# Patient Record
Sex: Female | Born: 1970
Health system: Southern US, Community
[De-identification: ages and names within clinical notes are randomized; demographics above are authoritative.]

## PROBLEM LIST (undated history)

## (undated) DIAGNOSIS — K439 Ventral hernia without obstruction or gangrene: Secondary | ICD-10-CM

## (undated) DIAGNOSIS — F419 Anxiety disorder, unspecified: Secondary | ICD-10-CM

## (undated) DIAGNOSIS — G43909 Migraine, unspecified, not intractable, without status migrainosus: Secondary | ICD-10-CM

## (undated) DIAGNOSIS — E785 Hyperlipidemia, unspecified: Secondary | ICD-10-CM

## (undated) DIAGNOSIS — I10 Essential (primary) hypertension: Secondary | ICD-10-CM

## (undated) HISTORY — PX: ABDOMINAL HYSTERECTOMY: SHX81

## (undated) HISTORY — DX: Ventral hernia without obstruction or gangrene: K43.9

## (undated) HISTORY — DX: Essential (primary) hypertension: I10

## (undated) HISTORY — DX: Hyperlipidemia, unspecified: E78.5

## (undated) HISTORY — DX: Migraine, unspecified, not intractable, without status migrainosus: G43.909

## (undated) HISTORY — PX: HERNIA REPAIR: SHX51

---

## 1998-07-08 HISTORY — PX: NASAL TURBINATE REDUCTION: SHX2072

## 2003-11-15 ENCOUNTER — Inpatient Hospital Stay (HOSPITAL_COMMUNITY): Admission: RE | Admit: 2003-11-15 | Discharge: 2003-11-18 | Payer: Self-pay | Admitting: Obstetrics and Gynecology

## 2006-07-08 HISTORY — PX: TONSILLECTOMY: SUR1361

## 2011-06-18 ENCOUNTER — Ambulatory Visit (INDEPENDENT_AMBULATORY_CARE_PROVIDER_SITE_OTHER): Payer: 59

## 2011-06-18 DIAGNOSIS — L258 Unspecified contact dermatitis due to other agents: Secondary | ICD-10-CM

## 2011-07-18 ENCOUNTER — Ambulatory Visit (INDEPENDENT_AMBULATORY_CARE_PROVIDER_SITE_OTHER): Payer: 59

## 2011-07-18 DIAGNOSIS — I10 Essential (primary) hypertension: Secondary | ICD-10-CM

## 2011-07-18 DIAGNOSIS — E785 Hyperlipidemia, unspecified: Secondary | ICD-10-CM

## 2011-08-13 ENCOUNTER — Ambulatory Visit (INDEPENDENT_AMBULATORY_CARE_PROVIDER_SITE_OTHER): Payer: 59 | Admitting: Family Medicine

## 2011-08-13 VITALS — BP 132/91 | HR 65 | Temp 97.8°F | Resp 16 | Ht 65.25 in | Wt 205.0 lb

## 2011-08-13 DIAGNOSIS — R1084 Generalized abdominal pain: Secondary | ICD-10-CM

## 2011-08-13 DIAGNOSIS — K439 Ventral hernia without obstruction or gangrene: Secondary | ICD-10-CM

## 2011-08-13 NOTE — Progress Notes (Signed)
  Subjective:    Patient ID: Gabriela Torres, female    DOB: 22-Nov-1970, 41 y.o.   MRN: [id number]  HPI Presents c/o pain and bulging above umbilicus worse after heavy lifting.  Employment involves daily lifting of heavy coolers.  No nausea or vomiting No fever  TAH secondary to fibroids 2005 Review of Systems     Objective:   Physical Exam  Constitutional: She appears well-developed and well-nourished.  Neck: Normal range of motion. Neck supple.  Cardiovascular: Normal rate, regular rhythm and normal heart sounds.   Pulmonary/Chest: Effort normal and breath sounds normal.  Abdominal: Soft. Bowel sounds are normal. A hernia is present.            Assessment & Plan:   1. Abdominal wall hernia  Ambulatory referral to General Surgery   Anticipatory guidance

## 2011-08-14 ENCOUNTER — Telehealth: Payer: Self-pay

## 2011-08-14 NOTE — Telephone Encounter (Signed)
Pt would like to have something called in for pain that she is having from her hernia. [phone]

## 2011-08-14 NOTE — Telephone Encounter (Signed)
Dr. Hal Hope,  Please review this request. I do not see that this was discussed at her last OV.  Ryan

## 2011-08-15 NOTE — Telephone Encounter (Signed)
Pt calling again to check status of pain rx for hernia. Pt states she has surgery scheduled soon.  Best: [phone]  bf

## 2011-08-16 ENCOUNTER — Telehealth: Payer: Self-pay | Admitting: Family Medicine

## 2011-08-16 MED ORDER — TRAMADOL HCL 50 MG PO TABS: 50.0000 mg | ORAL_TABLET | Freq: Three times a day (TID) | ORAL | Status: AC | PRN

## 2011-08-16 NOTE — Telephone Encounter (Signed)
PT CALLING AGAIN TO CHECK ON PAIN MEDS

## 2011-08-16 NOTE — Telephone Encounter (Signed)
Called pt to let her know rx called into the [facility] on [address] in [place]. Dr Hal Hope sent.

## 2011-08-19 ENCOUNTER — Telehealth: Payer: Self-pay

## 2011-08-19 NOTE — Telephone Encounter (Signed)
Pt states was given a rx for tramodal but it is not helping and would like to have something else called in

## 2011-08-21 ENCOUNTER — Telehealth: Payer: Self-pay

## 2011-08-21 NOTE — Telephone Encounter (Signed)
.  [facility] PT HAVE BEEN CALLING QUITE A BIT REGARDING HER MEDICINE, THE TRAMADOL ISN'T WORKING AND SHE NEEDED SOMETHING ELSE FOR PAIN CALLED IN PLEASE CALL PT AT [phone]   [facility] ON [address] IN [place]

## 2011-08-21 NOTE — Telephone Encounter (Signed)
Pt called wanting to make sure you understood that she is not allergic to codeine derivatives.  She is also not allergic to hydrocodone.  Pt states she is only allergic to the codeine/hydrocodone cough syrup.  Tramadol is just not cutting it for her pain especially while she is trying to work.  Please address.

## 2011-08-21 NOTE — Telephone Encounter (Signed)
Patient needing stronger pain medication. Patient is allergic to codeine derivatives. She can try taking 2 ultram every 12 hours as needed.  If pain persistant or worsening encourage her to RTC as she seemed fairly comfortable at her OV.

## 2011-08-21 NOTE — Telephone Encounter (Signed)
Dr. Hal Hope pt states the tramadol is not easing the pain for her Hernia.  She sees the specialist on Feb 26. Would like a call back [phone]

## 2011-08-21 NOTE — Telephone Encounter (Signed)
LMOM with specific instructions per Dr Hal Hope and to RTC if increased dose does not help. Asked for CB with any other questions.

## 2011-08-22 MED ORDER — HYDROCODONE-ACETAMINOPHEN 5-500 MG PO TABS: 1.0000 | ORAL_TABLET | Freq: Three times a day (TID) | ORAL | Status: AC | PRN

## 2011-08-22 NOTE — Telephone Encounter (Signed)
Please call in [place] 5/500 1 po q 8 hours prn #[phone]

## 2011-08-22 NOTE — Telephone Encounter (Signed)
LMOM THAT PATIENT CAN PICK UP RX THAT WAS FAXED.

## 2011-08-22 NOTE — Telephone Encounter (Signed)
Written and signed - just needs to be faxed.

## 2011-09-03 ENCOUNTER — Encounter (INDEPENDENT_AMBULATORY_CARE_PROVIDER_SITE_OTHER): Payer: Self-pay | Admitting: General Surgery

## 2011-09-03 ENCOUNTER — Ambulatory Visit (INDEPENDENT_AMBULATORY_CARE_PROVIDER_SITE_OTHER): Payer: 59 | Admitting: General Surgery

## 2011-09-03 VITALS — BP 146/88 | HR 68 | Temp 97.9°F | Resp 18 | Ht 65.0 in | Wt 205.2 lb

## 2011-09-03 DIAGNOSIS — K439 Ventral hernia without obstruction or gangrene: Secondary | ICD-10-CM | POA: Insufficient documentation

## 2011-09-03 NOTE — Patient Instructions (Signed)
We will call with results of CT scan.

## 2011-09-03 NOTE — Progress Notes (Signed)
Subjective:     Patient ID: Gabriela Torres, female   DOB: 12/01/1970, 41 y.o.   MRN: [id number]  HPI We are asked to see the patient in consultation by Dr. Nadyne Coombes to evaluate her for a ventral hernia. The patient is a 41 year old black female who has been experiencing abdominal pain for about the last 3 weeks. She does not remember any particular event that started her pain but she has been lifting heavy jug of water at work. The pain originally started down low on her abdomen near her midline. The pain has migrated up and now she states that she has a burning pain across her upper abdomen. She denies any nausea or vomiting. She denies any fevers or chills. Her bowels have been moving pretty regularly.  Review of Systems  Constitutional: Negative.   HENT: Negative.   Eyes: Negative.   Respiratory: Negative.   Cardiovascular: Negative.   Gastrointestinal: Positive for abdominal pain. Negative for abdominal distention.  Genitourinary: Negative.   Musculoskeletal: Negative.   Skin: Negative.   Neurological: Negative.   Hematological: Negative.   Psychiatric/Behavioral: Negative.        Objective:   Physical Exam  Constitutional: She is oriented to person, place, and time. She appears well-developed and well-nourished.  HENT:  Head: Normocephalic and atraumatic.  Eyes: Conjunctivae and EOM are normal. Pupils are equal, round, and reactive to light.  Neck: Normal range of motion. Neck supple.  Cardiovascular: Normal rate, regular rhythm and normal heart sounds.   Pulmonary/Chest: Effort normal and breath sounds normal.  Abdominal: Soft. Bowel sounds are normal.       She has some palpable fullness just to the right of her midline incision and just above her umbilicus. She has no palpable fascial defect. No abdominal distention. She is tender over the palpable fullness.  Musculoskeletal: Normal range of motion.  Neurological: She is alert and oriented to person, place, and time.    Skin: Skin is warm and dry.  Psychiatric: She has a normal mood and affect. Her behavior is normal.       Assessment:     Abdominal pain with a possible ventral hernia.    Plan:     At this point I would like to obtain a CT scan to confirm that she has a ventral hernia and to evaluate the size and extent of the hernia. Since her pain has seemed to be remote from the palpable fullness I would also like to make sure that we are not missing anything else in her abdomen before we operate on her. We will call her with the results of the study and plan for a laparoscopic assisted ventral hernia if this is all that's found. I've discussed with her in detail the risks and benefits of the operation to fix the hernia as well as some of the technical aspects including the risks of infection and injury to the intestine and she understands and wishes to proceed

## 2011-09-06 ENCOUNTER — Ambulatory Visit
Admission: RE | Admit: 2011-09-06 | Discharge: 2011-09-06 | Disposition: A | Discharge status: Home / Self Care | Payer: 59 | Source: Ambulatory Visit | Attending: General Surgery | Admitting: General Surgery

## 2011-09-06 DIAGNOSIS — K439 Ventral hernia without obstruction or gangrene: Secondary | ICD-10-CM

## 2011-09-06 MED ORDER — IOHEXOL 300 MG/ML  SOLN
125.0000 mL | Freq: Once | INTRAMUSCULAR | Status: AC | PRN
  Administered 2011-09-06: 125 mL via INTRAVENOUS

## 2011-09-09 ENCOUNTER — Other Ambulatory Visit (INDEPENDENT_AMBULATORY_CARE_PROVIDER_SITE_OTHER): Payer: Self-pay | Admitting: General Surgery

## 2011-09-09 NOTE — Progress Notes (Signed)
Done- Orders to schedulers box.

## 2011-10-10 NOTE — Telephone Encounter (Signed)
Hernia discussed along with pain management 08/16/11

## 2011-10-18 ENCOUNTER — Encounter (HOSPITAL_COMMUNITY): Payer: Self-pay

## 2011-10-24 NOTE — Pre-Procedure Instructions (Addendum)
[address] Rideout Segreto  10/24/2011   Your procedure is scheduled on:  Friday, 11/01/2011   Report to [facility] at 5:30 AM.  Call this number if you have problems the morning of surgery: [phone]   Remember:   Do not eat food:After Midnight.  May have clear liquids: up to 4 Hours before arrival(nothing after 1:30 AM).  Clear liquids include soda, tea, black coffee, apple or grape juice, broth.  Take these medicines the morning of surgery with A SIP OF WATER: Xanax, Lexapro  * Discontinue Aspirin,advil any nsaids, Coumadion, Plavix, Effient, and herbal meds 7 days prior to surgery.   Do not wear jewelry, make-up or nail polish.  Do not wear lotions, powders, or perfumes. You may wear deodorant.  Do not shave 48 hours prior to surgery.  Do not bring valuables to the hospital.  Contacts, dentures or bridgework may not be worn into surgery.  Leave suitcase in the car. After surgery it may be brought to your room.  For patients admitted to the hospital, checkout time is 11:00 AM the day of discharge.   Patients discharged the day of surgery will not be allowed to drive home.  Name and phone number of your driver: Clydie Braun [phone] sister  Special Instructions: CHG Shower Use Special Wash: 1/2 bottle night before surgery and 1/2 bottle morning of surgery.   Please read over the following fact sheets that you were given: Pain Booklet, Coughing and Deep Breathing and Surgical Site Infection Prevention

## 2011-10-25 ENCOUNTER — Encounter (HOSPITAL_COMMUNITY)
Admission: RE | Admit: 2011-10-25 | Discharge: 2011-10-25 | Disposition: A | Discharge status: Home / Self Care | Payer: 59 | Source: Ambulatory Visit | Attending: Anesthesiology | Admitting: Anesthesiology

## 2011-10-25 ENCOUNTER — Encounter (HOSPITAL_COMMUNITY): Payer: Self-pay

## 2011-10-25 ENCOUNTER — Encounter (HOSPITAL_COMMUNITY)
Admission: RE | Admit: 2011-10-25 | Discharge: 2011-10-25 | Disposition: A | Discharge status: Home / Self Care | Payer: 59 | Source: Ambulatory Visit | Attending: General Surgery | Admitting: General Surgery

## 2011-10-25 HISTORY — DX: Anxiety disorder, unspecified: F41.9

## 2011-10-25 LAB — BASIC METABOLIC PANEL
BUN: 13 mg/dL (ref 6–23)
Calcium: 9.5 mg/dL (ref 8.4–10.5)
GFR calc Af Amer: >90 mL/min (ref >90)
GFR calc non Af Amer: >90 mL/min (ref >90)
Glucose, Bld: 85 mg/dL (ref 70–99)
Potassium: 3.6 mEq/L (ref 3.5–5.1)
Sodium: 137 mEq/L (ref 135–145)

## 2011-10-25 LAB — CBC
HCT: 37.1 % (ref 36.0–46.0)
Platelets: 221 10*3/uL (ref 150–400)
RBC: 4.95 MIL/uL (ref 3.87–5.11)
RDW: 14.9 % (ref 11.5–15.5)
WBC: 5.1 10*3/uL (ref 4.0–10.5)

## 2011-10-25 LAB — CARDIAC PANEL(CRET KIN+CKTOT+MB+TROPI)
CK, MB: 1.5 ng/mL (ref 0.3–4.0)
Relative Index: 1.1 (ref 0.0–2.5)
Troponin I: <0.3 ng/mL (ref <0.30)

## 2011-10-25 LAB — SURGICAL PCR SCREEN: MRSA, PCR: NEGATIVE

## 2011-10-25 MED ORDER — CHLORHEXIDINE GLUCONATE 4 % EX LIQD: 1.0000 "application " | Freq: Once | CUTANEOUS | Status: DC

## 2011-10-25 NOTE — Progress Notes (Addendum)
  ekg abn in preop.  Dr Phoebe Perch called. To show to [facility]  [facility]. Orders for further labs. parient waiting for results Dr cooper  Cardiac called    had reviewed ekg. No orders.

## 2011-10-25 NOTE — Consult Note (Addendum)
Anesthesia:  Patient is a 41 year old female scheduled for a laparoscopic VHR with mesh on 11/01/11.  History includes HTN, HLD, former smoker, anxiety, migraines, prior TAH, tonsillectomy, and turbinate reduction.  PCP is Dr. Nadyne Coombes at [facility].  I saw her earlier today during her PAT appointment due to her EKG findings showing SR with first degree AVB, mild (1-2 small boxes) ST elevation in lateral leads, cannot rule out anterior infarct (age undetermined).  She has never had a prior EKG or any cardiac studies.  She denies CP currently.  She denies SOB, nausea, diaphoresis, jaw or arm pain.  She has had periodic twinges of chest pain that typically only last a few seconds and have no associated symptoms.  These have occurred at rest and go away on their own.  Up to when her hernia began causing her discomfort a few months ago she was running weekly (5K) and did not have any chest pain with this type of activity.  These sound atypical for cardiac etiology.  She has no family history of CAD, but a sister does have CHF secondary to viral cardiomyopathy.  Exam findings show her heart with a RRR, slightly bradycardic at 57-60 bpm.  No murmurs appreciated.  Lungs clear.  No carotid bruits auscultated.  No significant edema noted.  CXR shows no acute abnormalities.  Labs acceptable.  Due to her abnormal EKG one set of cardiac enzymes were added to her lab work.  Patient remained in Short Stay until her lab work was back.  These were WNL showing a CK of 141, MB 1.5, and Trop I < 0.30.    Dr. Excell Seltzer who was the Cardiologist interpreting her EKG also called and spoke with the PAT nurse to ensure patient was asymptomatic.  Since she was, he felt this was most likely a normal variant.    I reviewed above and patient's EKG with Anesthesiologist Dr. Chaney Malling who agrees that since patient is currently asymptomatic, cardiac enzymes are WNL, and she had excellent exercise tolerance up to a few  months ago due to pain from her hernia, that she should be okay to proceed if no new worrisome symptoms.  Patient was updated, and told that it did not appear that she was actually having a MI, but to seek immediate medical attention if CV symptoms developed.

## 2011-10-31 MED ORDER — CEFAZOLIN SODIUM-DEXTROSE 2-3 GM-% IV SOLR
2.0000 g | INTRAVENOUS | Status: AC
  Administered 2011-11-01: 2 g via INTRAVENOUS
  Filled 2011-10-31: qty 50

## 2011-11-01 ENCOUNTER — Inpatient Hospital Stay (HOSPITAL_COMMUNITY)
Admission: RE | Admit: 2011-11-01 | Discharge: 2011-11-03 | DRG: 355 | Disposition: A | Discharge status: Home / Self Care | Payer: 59 | Source: Ambulatory Visit | Attending: General Surgery | Admitting: General Surgery

## 2011-11-01 ENCOUNTER — Encounter (HOSPITAL_COMMUNITY)
Admission: RE | Disposition: A | Discharge status: Home / Self Care | Payer: Self-pay | Source: Ambulatory Visit | Attending: General Surgery

## 2011-11-01 ENCOUNTER — Encounter (HOSPITAL_COMMUNITY): Payer: Self-pay | Admitting: Vascular Surgery

## 2011-11-01 ENCOUNTER — Encounter (HOSPITAL_COMMUNITY): Payer: Self-pay | Admitting: *Deleted

## 2011-11-01 ENCOUNTER — Ambulatory Visit (HOSPITAL_COMMUNITY): Payer: 59 | Admitting: Vascular Surgery

## 2011-11-01 DIAGNOSIS — I10 Essential (primary) hypertension: Secondary | ICD-10-CM | POA: Diagnosis present

## 2011-11-01 DIAGNOSIS — E669 Obesity, unspecified: Secondary | ICD-10-CM | POA: Diagnosis present

## 2011-11-01 DIAGNOSIS — Z6832 Body mass index (BMI) 32.0-32.9, adult: Secondary | ICD-10-CM

## 2011-11-01 DIAGNOSIS — K439 Ventral hernia without obstruction or gangrene: Secondary | ICD-10-CM

## 2011-11-01 DIAGNOSIS — R51 Headache: Secondary | ICD-10-CM | POA: Diagnosis present

## 2011-11-01 DIAGNOSIS — F411 Generalized anxiety disorder: Secondary | ICD-10-CM | POA: Diagnosis present

## 2011-11-01 DIAGNOSIS — Z01810 Encounter for preprocedural cardiovascular examination: Secondary | ICD-10-CM

## 2011-11-01 HISTORY — PX: VENTRAL HERNIA REPAIR: SHX424

## 2011-11-01 SURGERY — REPAIR, HERNIA, VENTRAL, LAPAROSCOPIC
Anesthesia: General | Site: Abdomen | Wound class: Clean

## 2011-11-01 MED ORDER — ALPRAZOLAM 0.25 MG PO TABS: 0.2500 mg | ORAL_TABLET | Freq: Three times a day (TID) | ORAL | Status: DC | PRN

## 2011-11-01 MED ORDER — ESCITALOPRAM OXALATE 20 MG PO TABS
30.0000 mg | ORAL_TABLET | Freq: Every day | ORAL | Status: DC
  Administered 2011-11-02 – 2011-11-03 (×2): 30 mg via ORAL
  Filled 2011-11-01 (×2): qty 1

## 2011-11-01 MED ORDER — HYDROCODONE-ACETAMINOPHEN 5-325 MG PO TABS
1.0000 | ORAL_TABLET | ORAL | Status: DC | PRN
  Administered 2011-11-01 (×3): 2 via ORAL
  Administered 2011-11-01: 1 via ORAL
  Administered 2011-11-02 – 2011-11-03 (×5): 2 via ORAL
  Filled 2011-11-01 (×8): qty 2

## 2011-11-01 MED ORDER — FLUTICASONE PROPIONATE 50 MCG/ACT NA SUSP
2.0000 | Freq: Every day | NASAL | Status: DC
  Administered 2011-11-03: 2 via NASAL
  Filled 2011-11-01: qty 16

## 2011-11-01 MED ORDER — LACTATED RINGERS IV SOLN
INTRAVENOUS | Status: DC | PRN
  Administered 2011-11-01 (×3): via INTRAVENOUS

## 2011-11-01 MED ORDER — HYDROMORPHONE HCL PF 1 MG/ML IJ SOLN
0.2500 mg | INTRAMUSCULAR | Status: DC | PRN
  Administered 2011-11-01 (×4): 0.5 mg via INTRAVENOUS

## 2011-11-01 MED ORDER — ROCURONIUM BROMIDE 100 MG/10ML IV SOLN
INTRAVENOUS | Status: DC | PRN
  Administered 2011-11-01: 40 mg via INTRAVENOUS
  Administered 2011-11-01: 10 mg via INTRAVENOUS

## 2011-11-01 MED ORDER — FLUTICASONE PROPIONATE 50 MCG/ACT NA SUSP: 3.0000 | Freq: Every day | NASAL | Status: DC

## 2011-11-01 MED ORDER — HYDROCODONE-ACETAMINOPHEN 5-325 MG PO TABS
ORAL_TABLET | ORAL | Status: AC
  Filled 2011-11-01: qty 2

## 2011-11-01 MED ORDER — DEXAMETHASONE SODIUM PHOSPHATE 10 MG/ML IJ SOLN
INTRAMUSCULAR | Status: DC | PRN
  Administered 2011-11-01: 4 mg via INTRAVENOUS

## 2011-11-01 MED ORDER — ONDANSETRON HCL 4 MG PO TABS: 4.0000 mg | ORAL_TABLET | Freq: Four times a day (QID) | ORAL | Status: DC | PRN

## 2011-11-01 MED ORDER — LISINOPRIL 20 MG PO TABS
20.0000 mg | ORAL_TABLET | Freq: Every day | ORAL | Status: DC
  Administered 2011-11-01 – 2011-11-03 (×3): 20 mg via ORAL
  Filled 2011-11-01 (×3): qty 1

## 2011-11-01 MED ORDER — SUMATRIPTAN SUCCINATE 25 MG PO TABS
25.0000 mg | ORAL_TABLET | ORAL | Status: DC | PRN
Start: 2011-11-01 — End: 2011-11-03
  Filled 2011-11-01: qty 1

## 2011-11-01 MED ORDER — EZETIMIBE 10 MG PO TABS
10.0000 mg | ORAL_TABLET | Freq: Every day | ORAL | Status: DC
  Administered 2011-11-02 – 2011-11-03 (×2): 10 mg via ORAL
  Filled 2011-11-01 (×2): qty 1

## 2011-11-01 MED ORDER — ESCITALOPRAM OXALATE 10 MG PO TABS: 10.0000 mg | ORAL_TABLET | Freq: Every day | ORAL | Status: DC

## 2011-11-01 MED ORDER — KCL IN DEXTROSE-NACL 20-5-0.9 MEQ/L-%-% IV SOLN
INTRAVENOUS | Status: DC
  Administered 2011-11-01: 14:00:00 via INTRAVENOUS
  Administered 2011-11-02: 1000 mL via INTRAVENOUS
  Administered 2011-11-03: 04:00:00 via INTRAVENOUS
  Filled 2011-11-01 (×5): qty 1000

## 2011-11-01 MED ORDER — GLYCOPYRROLATE 0.2 MG/ML IJ SOLN
INTRAMUSCULAR | Status: DC | PRN
  Administered 2011-11-01: 0.6 mg via INTRAVENOUS

## 2011-11-01 MED ORDER — KCL IN DEXTROSE-NACL 20-5-0.9 MEQ/L-%-% IV SOLN
INTRAVENOUS | Status: DC
  Administered 2011-11-02: 15:00:00 via INTRAVENOUS
  Filled 2011-11-01 (×7): qty 1000

## 2011-11-01 MED ORDER — SUMATRIPTAN SUCCINATE 25 MG PO TABS: 25.0000 mg | ORAL_TABLET | ORAL | Status: DC | PRN

## 2011-11-01 MED ORDER — LISINOPRIL-HYDROCHLOROTHIAZIDE 20-12.5 MG PO TABS: 1.0000 | ORAL_TABLET | Freq: Every day | ORAL | Status: DC

## 2011-11-01 MED ORDER — KCL IN DEXTROSE-NACL 20-5-0.45 MEQ/L-%-% IV SOLN
INTRAVENOUS | Status: AC
  Filled 2011-11-01: qty 1000

## 2011-11-01 MED ORDER — PROMETHAZINE HCL 25 MG/ML IJ SOLN
6.2500 mg | INTRAMUSCULAR | Status: DC | PRN
  Administered 2011-11-01: 6.25 mg via INTRAVENOUS

## 2011-11-01 MED ORDER — LIDOCAINE HCL (CARDIAC) 20 MG/ML IV SOLN
INTRAVENOUS | Status: DC | PRN
  Administered 2011-11-01: 100 mg via INTRAVENOUS

## 2011-11-01 MED ORDER — SODIUM CHLORIDE 0.9 % IR SOLN
Status: DC | PRN
  Administered 2011-11-01: 1000 mL

## 2011-11-01 MED ORDER — PANTOPRAZOLE SODIUM 40 MG IV SOLR
40.0000 mg | INTRAVENOUS | Status: DC
  Administered 2011-11-01: 40 mg via INTRAVENOUS
  Filled 2011-11-01 (×2): qty 40

## 2011-11-01 MED ORDER — MORPHINE SULFATE 4 MG/ML IJ SOLN
4.0000 mg | INTRAMUSCULAR | Status: DC | PRN
  Administered 2011-11-01 – 2011-11-03 (×7): 4 mg via INTRAVENOUS
  Filled 2011-11-01 (×7): qty 1

## 2011-11-01 MED ORDER — BUPIVACAINE-EPINEPHRINE 0.25% -1:200000 IJ SOLN
INTRAMUSCULAR | Status: DC | PRN
  Administered 2011-11-01: 30 mL

## 2011-11-01 MED ORDER — ONDANSETRON HCL 4 MG/2ML IJ SOLN
INTRAMUSCULAR | Status: DC | PRN
  Administered 2011-11-01: 4 mg via INTRAVENOUS

## 2011-11-01 MED ORDER — ESCITALOPRAM OXALATE 20 MG PO TABS: 20.0000 mg | ORAL_TABLET | Freq: Every day | ORAL | Status: DC

## 2011-11-01 MED ORDER — PROPOFOL 10 MG/ML IV EMUL
INTRAVENOUS | Status: DC | PRN
  Administered 2011-11-01: 200 mg via INTRAVENOUS

## 2011-11-01 MED ORDER — HYDROMORPHONE HCL PF 1 MG/ML IJ SOLN
INTRAMUSCULAR | Status: AC
  Filled 2011-11-01: qty 1

## 2011-11-01 MED ORDER — FENTANYL CITRATE 0.05 MG/ML IJ SOLN
INTRAMUSCULAR | Status: DC | PRN
  Administered 2011-11-01 (×2): 50 ug via INTRAVENOUS
  Administered 2011-11-01: 150 ug via INTRAVENOUS

## 2011-11-01 MED ORDER — NEOSTIGMINE METHYLSULFATE 1 MG/ML IJ SOLN
INTRAMUSCULAR | Status: DC | PRN
  Administered 2011-11-01: 3 mg via INTRAVENOUS

## 2011-11-01 MED ORDER — ONDANSETRON HCL 4 MG/2ML IJ SOLN: 4.0000 mg | Freq: Four times a day (QID) | INTRAMUSCULAR | Status: DC | PRN

## 2011-11-01 MED ORDER — PROMETHAZINE HCL 25 MG/ML IJ SOLN
INTRAMUSCULAR | Status: AC
  Filled 2011-11-01: qty 1

## 2011-11-01 MED ORDER — MIDAZOLAM HCL 5 MG/5ML IJ SOLN
INTRAMUSCULAR | Status: DC | PRN
  Administered 2011-11-01: 2 mg via INTRAVENOUS

## 2011-11-01 MED ORDER — HYDROCHLOROTHIAZIDE 12.5 MG PO CAPS
12.5000 mg | ORAL_CAPSULE | Freq: Every day | ORAL | Status: DC
  Administered 2011-11-01 – 2011-11-03 (×3): 12.5 mg via ORAL
  Filled 2011-11-01 (×3): qty 1

## 2011-11-01 SURGICAL SUPPLY — 49 items
APPLIER CLIP LOGIC TI 5 (MISCELLANEOUS) IMPLANT
APPLIER CLIP ROT 10 11.4 M/L (STAPLE)
BANDAGE GAUZE ELAST BULKY 4 IN (GAUZE/BANDAGES/DRESSINGS) ×2 IMPLANT
CANISTER SUCTION 2500CC (MISCELLANEOUS) IMPLANT
CATH FOLEY 2WAY SLVR  5CC 14FR (CATHETERS) ×1
CATH FOLEY 2WAY SLVR 5CC 14FR (CATHETERS) ×1 IMPLANT
CHLORAPREP W/TINT 26ML (MISCELLANEOUS) ×2 IMPLANT
CLIP APPLIE ROT 10 11.4 M/L (STAPLE) IMPLANT
CLOTH BEACON ORANGE TIMEOUT ST (SAFETY) ×2 IMPLANT
COVER SURGICAL LIGHT HANDLE (MISCELLANEOUS) ×2 IMPLANT
DECANTER SPIKE VIAL GLASS SM (MISCELLANEOUS) ×2 IMPLANT
DERMABOND ADHESIVE PROPEN (GAUZE/BANDAGES/DRESSINGS) ×1
DERMABOND ADVANCED (GAUZE/BANDAGES/DRESSINGS) ×1
DERMABOND ADVANCED .7 DNX12 (GAUZE/BANDAGES/DRESSINGS) ×1 IMPLANT
DERMABOND ADVANCED .7 DNX6 (GAUZE/BANDAGES/DRESSINGS) ×1 IMPLANT
DEVICE SECURE STRAP 25 ABSORB (INSTRUMENTS) ×2 IMPLANT
DEVICE TROCAR PUNCTURE CLOSURE (ENDOMECHANICALS) ×2 IMPLANT
DRAPE INCISE IOBAN 66X45 STRL (DRAPES) ×2 IMPLANT
DRAPE UTILITY 15X26 W/TAPE STR (DRAPE) ×4 IMPLANT
ELECT CAUTERY BLADE 6.4 (BLADE) ×2 IMPLANT
ELECT REM PT RETURN 9FT ADLT (ELECTROSURGICAL) ×2
ELECTRODE REM PT RTRN 9FT ADLT (ELECTROSURGICAL) ×1 IMPLANT
GLOVE BIO SURGEON STRL SZ7.5 (GLOVE) ×2 IMPLANT
GLOVE BIO SURGEON STRL SZ8 (GLOVE) ×2 IMPLANT
GOWN PREVENTION PLUS XLARGE (GOWN DISPOSABLE) ×2 IMPLANT
GOWN STRL NON-REIN LRG LVL3 (GOWN DISPOSABLE) ×8 IMPLANT
KIT BASIN OR (CUSTOM PROCEDURE TRAY) ×2 IMPLANT
KIT ROOM TURNOVER OR (KITS) ×2 IMPLANT
MESH PHYSIO OVAL 10X15CM (Mesh General) ×2 IMPLANT
NEEDLE SPNL 22GX3.5 QUINCKE BK (NEEDLE) ×2 IMPLANT
NS IRRIG 1000ML POUR BTL (IV SOLUTION) ×2 IMPLANT
PAD ARMBOARD 7.5X6 YLW CONV (MISCELLANEOUS) ×2 IMPLANT
PEN SKIN MARKING BROAD (MISCELLANEOUS) ×2 IMPLANT
PENCIL BUTTON HOLSTER BLD 10FT (ELECTRODE) ×2 IMPLANT
SCALPEL HARMONIC ACE (MISCELLANEOUS) ×2 IMPLANT
SCISSORS LAP 5X35 DISP (ENDOMECHANICALS) IMPLANT
SET IRRIG TUBING LAPAROSCOPIC (IRRIGATION / IRRIGATOR) IMPLANT
SLEEVE ENDOPATH XCEL 5M (ENDOMECHANICALS) ×4 IMPLANT
SUT MNCRL AB 4-0 PS2 18 (SUTURE) ×4 IMPLANT
SUT NOVA NAB DX-16 0-1 5-0 T12 (SUTURE) ×6 IMPLANT
TOWEL OR 17X24 6PK STRL BLUE (TOWEL DISPOSABLE) ×2 IMPLANT
TOWEL OR 17X26 10 PK STRL BLUE (TOWEL DISPOSABLE) ×2 IMPLANT
TRAY FOLEY CATH 14FR (SET/KITS/TRAYS/PACK) ×2 IMPLANT
TRAY LAPAROSCOPIC (CUSTOM PROCEDURE TRAY) ×2 IMPLANT
TROCAR XCEL BLUNT TIP 100MML (ENDOMECHANICALS) IMPLANT
TROCAR XCEL NON-BLD 11X100MML (ENDOMECHANICALS) IMPLANT
TROCAR XCEL NON-BLD 5MMX100MML (ENDOMECHANICALS) ×2 IMPLANT
TUBE CONNECTING 12X1/4 (SUCTIONS) ×2 IMPLANT
YANKAUER SUCT BULB TIP NO VENT (SUCTIONS) ×2 IMPLANT

## 2011-11-01 NOTE — Preoperative (Signed)
Beta Blockers   Reason not to administer Beta Blockers:Not Applicable 

## 2011-11-01 NOTE — Progress Notes (Signed)
UR complete 

## 2011-11-01 NOTE — H&P (Signed)
Gabriela Torres   09/03/2011 9:10 AM Office Visit  MRN: [id number]   Description: 40 year old female  Provider: Robyne Askew, MD  Department: [facility]        Diagnoses     Ventral hernia   - Primary    553.20      Reason for Visit     Hernia    Abdominal wall        Vitals - Last Recorded       BP Pulse Temp(Src) Resp Ht Wt    146/88  68  97.9 F (36.6 C) (Temporal)  18  5\' 5"  (1.651 m)  205 lb 3.2 oz (93.078 kg)          BMI              34.15 kg/m2                 Progress Notes     Robyne Askew, MD  09/03/2011 10:13 AM  SignedSubjective:      Patient ID: Gabriela Torres, female   DOB: March 19, 1971, 41 y.o.   MRN: [id number]   HPI We are asked to see the patient in consultation by Dr. Nadyne Coombes to evaluate her for a ventral hernia. The patient is a 41 year old black female who has been experiencing abdominal pain for about the last 3 weeks. She does not remember any particular event that started her pain but she has been lifting heavy jug of water at work. The pain originally started down low on her abdomen near her midline. The pain has migrated up and now she states that she has a burning pain across her upper abdomen. She denies any nausea or vomiting. She denies any fevers or chills. Her bowels have been moving pretty regularly.   Review of Systems  Constitutional: Negative.   HENT: Negative.   Eyes: Negative.   Respiratory: Negative.   Cardiovascular: Negative.   Gastrointestinal: Positive for abdominal pain. Negative for abdominal distention.  Genitourinary: Negative.   Musculoskeletal: Negative.   Skin: Negative.   Neurological: Negative.   Hematological: Negative.   Psychiatric/Behavioral: Negative.       Objective:    Physical Exam  Constitutional: She is oriented to person, place, and time. She appears well-developed and well-nourished.  HENT:   Head: Normocephalic and atraumatic.  Eyes: Conjunctivae and EOM are normal.  Pupils are equal, round, and reactive to light.  Neck: Normal range of motion. Neck supple.  Cardiovascular: Normal rate, regular rhythm and normal heart sounds.   Pulmonary/Chest: Effort normal and breath sounds normal.  Abdominal: Soft. Bowel sounds are normal.       She has some palpable fullness just to the right of her midline incision and just above her umbilicus. She has no palpable fascial defect. No abdominal distention. She is tender over the palpable fullness.  Musculoskeletal: Normal range of motion.  Neurological: She is alert and oriented to person, place, and time.  Skin: Skin is warm and dry.  Psychiatric: She has a normal mood and affect. Her behavior is normal.      Assessment:    Abdominal pain with a possible ventral hernia.   Plan:      At this point I would like to obtain a CT scan to confirm that she has a ventral hernia and to evaluate the size and extent of the hernia. Since her pain has seemed to be remote from the palpable fullness I  would also like to make sure that we are not missing anything else in her abdomen before we operate on her. We will call her with the results of the study and plan for a laparoscopic assisted ventral hernia if this is all that's found. I've discussed with her in detail the risks and benefits of the operation to fix the hernia as well as some of the technical aspects including the risks of infection and injury to the intestine and she understands and wishes to proceed                Not recorded       Discontinued Medications         Reason for Discontinue    ARIPiprazole (ABILIFY) 10 MG tablet Error       Orders Placed This Encounter       Future Orders    CT Abdomen Pelvis W Contrast [[id number] Custom]    Expected By: 10/11/11    Expires: 12/03/12         Patient Instructions     We will call with results of CT scan       Level of Service     PR OFFICE CONSULTATION NEW/ESTAB PATIENT 40 MIN [91478]          All Flowsheet Templates (all recorded)     Encounter Vitals Flowsheet    Custom Formula Data Flowsheet    Anthropometrics Flowsheet               Referring Provider          Dois Davenport, MD       All Charges for This Encounter       Code Description Service Date Service Provider Modifiers Quantity    [phone] PR OFFICE CONSULTATION NEW/ESTAB PATIENT 40 MIN 09/03/2011 Robyne Askew, MD   1        Other Encounter Related Information     Allergies & Medications         Problem List         History         Patient-Entered Questionnaires     No data filed

## 2011-11-01 NOTE — Anesthesia Postprocedure Evaluation (Signed)
  Anesthesia Post-op Note  Patient: Gabriela Torres  Procedure(s) Performed: Procedure(s) (LRB): LAPAROSCOPIC VENTRAL HERNIA (N/A) INSERTION OF MESH (N/A)  Patient Location: PACU  Anesthesia Type: General  Level of Consciousness: awake and alert   Airway and Oxygen Therapy: Patient Spontanous Breathing  Post-op Pain: mild  Post-op Assessment: Post-op Vital signs reviewed, Patient's Cardiovascular Status Stable, Respiratory Function Stable, Patent Airway and No signs of Nausea or vomiting  Post-op Vital Signs: stable  Complications: No apparent anesthesia complications

## 2011-11-01 NOTE — Interval H&P Note (Signed)
History and Physical Interval Note:  11/01/2011 7:20 AM  Gabriela Torres A Dib  has presented today for surgery, with the diagnosis of ventral hernia   The various methods of treatment have been discussed with the patient and family. After consideration of risks, benefits and other options for treatment, the patient has consented to  Procedure(s) (LRB): LAPAROSCOPIC VENTRAL HERNIA (N/A) INSERTION OF MESH (N/A) as a surgical intervention .  The patients' history has been reviewed, patient examined, no change in status, stable for surgery.  I have reviewed the patients' chart and labs.  Questions were answered to the patient's satisfaction.     TOTH III,Persephanie Laatsch S

## 2011-11-01 NOTE — Op Note (Signed)
11/01/2011  9:22 AM  PATIENT:  Gabriela Torres  41 y.o. female  PRE-OPERATIVE DIAGNOSIS:  ventral hernia   POST-OPERATIVE DIAGNOSIS:  ventral hernia   PROCEDURE:  Procedure(s) (LRB): LAPAROSCOPIC VENTRAL HERNIA (N/A) INSERTION OF MESH (N/A)  SURGEON:  Surgeon(s) and Role:    * Robyne Askew, MD - Primary    * Valarie Merino, MD - Assisting  PHYSICIAN ASSISTANT:   ASSISTANTS: Dr. Daphine Deutscher   ANESTHESIA:   general  EBL:  Total I/O In: 2000 [I.V.:2000] Out: 120 [Urine:100; Blood:20]  BLOOD ADMINISTERED:none  DRAINS: none   LOCAL MEDICATIONS USED:  MARCAINE     SPECIMEN:  No Specimen  DISPOSITION OF SPECIMEN:  N/A  COUNTS:  YES  TOURNIQUET:  * No tourniquets in log *  DICTATION: .Dragon Dictation After informed consent was obtained the patient was brought to the operating room and placed in the supine position on the operating room table. After adequate induction of general anesthesia the patient's abdomen was prepped with ChloraPrep, allowed to dry, and draped in usual sterile manner. This included the use of an Ioban drape. A site was chosen in the left upper quadrant for placement of a 5 mm port. This area was infiltrated with quarter percent Marcaine. Using an Optiview and a 5 mm camera we were able to bluntly dissected through the layers of the abdominal wall until we gained access to the abdominal cavity. The abdomen is immensely with carbon dioxide without difficulty. The abdomen was inspected and an obvious fascial defect was identified just above the umbilicus. There were some of the omental adhesions just below the defect. A second 5 mm port was placed in the left midabdomen under direct vision. Another 5 mm port was then placed in the right midabdomen under direct vision. A harmonic scalpel was then used to take down the omental adhesions to the anterior abdominal wall area this was done without difficulty. There was no intestine stuck to the anterior abdominal wall.  Once this was accomplished the abdominal wall is very free and easy to visualize. The falciform ligament was also taken down sharply with the harmonic scalpel so that we had a good distance all the way around the fascial defect. We then chose a 10 x 15 piece of Physio mesh.we placed 6 #1 Novafil stitches at equidistant points around the edge of the mesh. We then made an incision through the fascial defect on the abdominal wall with a 15 blade knife. The incision was carried through the subcutaneous tissue sharply with the electrocautery until the hernia sac was identified. The hernia sac was excised sharply with the electrocautery. The mesh was then placed within the abdominal cavity under the appropriate orientation. The fascia was closed over this with interrupted #1 Novafil stitches. The abdomen was then reinsufflated. 6 small stab incisions were made with an 11 blade knife corresponding to the position of the Novafil stitches. A suture passer was used to bring the tails of each Novafil stitch through the appropriate hole. Traction was then placed on the stitches and the mesh was observed to come into good apposition with the anterior abdominal wall. The hernia appeared to be well repaired. Each of these stitches was tied down and the tails divided. Place to secure strap tacker was then used to tack the edge of the mesh between the stitches. Once this was accomplished the mesh was nicely flat up against the abdominal wall without any redundancy. The abdomen since was inspected and no other  abnormalities were noted. The mesh appeared to be in place and hemostatic. At this point the gas was allowed to escape and the ports were removed. The port site incisions were closed with interrupted 4 Monocryl subcuticular stitches. The 6 stitch sites were closed with Dermabond. Sterile dressings were applied. The patient tolerated the procedure well. At the end of the case a needle sponge and instrument counts were correct.  The patient was then awakened and taken to recovery in stable condition.  PLAN OF CARE: Admit to inpatient   PATIENT DISPOSITION:  PACU - hemodynamically stable.   Delay start of Pharmacological VTE agent (>24hrs) due to surgical blood loss or risk of bleeding: yes

## 2011-11-01 NOTE — Anesthesia Preprocedure Evaluation (Addendum)
Anesthesia Evaluation  Patient identified by MRN, date of birth, ID band Patient awake    Reviewed: Allergy & Precautions, H&P , NPO status , Patient's Chart, lab work & pertinent test results  Airway Mallampati: II TM Distance: >3 FB Neck ROM: Full    Dental No notable dental hx.    Pulmonary neg pulmonary ROS,  CXR: NAD breath sounds clear to auscultation  Pulmonary exam normal       Cardiovascular hypertension, Pt. on medications Rhythm:Regular Rate:Normal  ECG reviewed. NSR with 1st degress AV block. Possible anterior infarct and mild lateral ST elevation   Neuro/Psych  Headaches, Anxiety    GI/Hepatic negative GI ROS, Neg liver ROS,   Endo/Other  negative endocrine ROS  Renal/GU negative Renal ROS  negative genitourinary   Musculoskeletal negative musculoskeletal ROS (+)   Abdominal (+) + obese,   Peds negative pediatric ROS (+)  Hematology negative hematology ROS (+)   Anesthesia Other Findings   Reproductive/Obstetrics S/p hysterectomy                           Anesthesia Physical Anesthesia Plan  ASA: II  Anesthesia Plan: General   Post-op Pain Management:    Induction: Intravenous  Airway Management Planned: Oral ETT  Additional Equipment:   Intra-op Plan:   Post-operative Plan: Extubation in OR  Informed Consent: I have reviewed the patients History and Physical, chart, labs and discussed the procedure including the risks, benefits and alternatives for the proposed anesthesia with the patient or authorized representative who has indicated his/her understanding and acceptance.   Dental advisory given  Plan Discussed with: CRNA  Anesthesia Plan Comments:         Anesthesia Quick Evaluation

## 2011-11-01 NOTE — Transfer of Care (Signed)
Immediate Anesthesia Transfer of Care Note  Patient: Gabriela Torres  Procedure(s) Performed: Procedure(s) (LRB): LAPAROSCOPIC VENTRAL HERNIA (N/A) INSERTION OF MESH (N/A)  Patient Location: PACU  Anesthesia Type: General  Level of Consciousness: awake, alert  and patient cooperative  Airway & Oxygen Therapy: Patient Spontanous Breathing and Patient connected to nasal cannula oxygen  Post-op Assessment: Report given to PACU RN  Post vital signs: Reviewed  Complications: No apparent anesthesia complications

## 2011-11-02 LAB — CBC
MCV: 76.5 fL — ABNORMAL LOW (ref 78.0–100.0)
Platelets: 190 10*3/uL (ref 150–400)
RBC: 4.6 MIL/uL (ref 3.87–5.11)
WBC: 7.8 10*3/uL (ref 4.0–10.5)

## 2011-11-02 LAB — BASIC METABOLIC PANEL
CO2: 30 mEq/L (ref 19–32)
Calcium: 9.1 mg/dL (ref 8.4–10.5)
GFR calc non Af Amer: >90 mL/min (ref >90)
Sodium: 139 mEq/L (ref 135–145)

## 2011-11-02 MED ORDER — PANTOPRAZOLE SODIUM 40 MG PO TBEC
40.0000 mg | DELAYED_RELEASE_TABLET | Freq: Every day | ORAL | Status: DC
  Administered 2011-11-02: 40 mg via ORAL
  Filled 2011-11-02: qty 1

## 2011-11-02 NOTE — Progress Notes (Signed)
1 Day Post-Op  Subjective: Complains of soreness but otherwise is doing well  Objective: Vital signs in last 24 hours: Temp:  [97 F (36.1 C)-99.3 F (37.4 C)] 99.3 F (37.4 C) (04/27 0655) Pulse Rate:  [65-104] 76  (04/27 0655) Resp:  [18-20] 18  (04/27 0655) BP: (129-146)/(63-88) 129/82 mmHg (04/27 0655) SpO2:  [93 %-100 %] 93 % (04/27 0655) Weight:  [205 lb 9.6 oz (93.26 kg)] 205 lb 9.6 oz (93.26 kg) (04/26 1229) Last BM Date: 10/31/11  Intake/Output from previous day: 04/26 0701 - 04/27 0700 In: 2930 [P.O.:480; I.V.:2450] Out: 120 [Urine:100; Blood:20] Intake/Output this shift:    GI: Soft, appropriately tender. no flatus yet. incisions look good  Lab Results:  No results found for this basename: WBC:2,HGB:2,HCT:2,PLT:2 in the last 72 hours BMET No results found for this basename: NA:2,K:2,CL:2,CO2:2,GLUCOSE:2,BUN:2,CREATININE:2,CALCIUM:2 in the last 72 hours PT/INR No results found for this basename: LABPROT:2,INR:2 in the last 72 hours ABG No results found for this basename: PHART:2,PCO2:2,PO2:2,HCO3:2 in the last 72 hours  Studies/Results: No results found.  Anti-infectives: Anti-infectives     Start     Dose/Rate Route Frequency Ordered Stop   10/31/11 1429   ceFAZolin (ANCEF) IVPB 2 g/50 mL premix        2 g 100 mL/hr over 30 Minutes Intravenous 60 min pre-op 10/31/11 1429 11/01/11 0725          Assessment/Plan: s/p Procedure(s) (LRB): LAPAROSCOPIC VENTRAL HERNIA (N/A) INSERTION OF MESH (N/A) Advance diet Ambulate Plan for discharge tomorrow  LOS: 1 day    TOTH III,Lottie Siska S 11/02/2011

## 2011-11-03 MED ORDER — HYDROCODONE-ACETAMINOPHEN 5-325 MG PO TABS: 1.0000 | ORAL_TABLET | Freq: Four times a day (QID) | ORAL | Status: AC | PRN

## 2011-11-03 NOTE — Discharge Summary (Signed)
Physician Discharge Summary  Patient ID: Gwendolyn Mclees Marzan MRN: [id number] DOB/AGE: 1971/04/13 41 y.o.  Admit date: 11/01/2011 Discharge date: 11/03/2011  Admission Diagnoses:  Discharge Diagnoses:  Active Problems:  * No active hospital problems. *    Discharged Condition: good  Hospital Course: the pt underwent lap vhr with mesh. She tolerated it well. Her postop course was uneventful. On pod 2 she was ready for discharge home  Consults: none  Significant Diagnostic Studies: none  Treatments: surgery: lap vhr with mesh  Discharge Exam: Blood pressure 128/80, pulse 75, temperature 98.3 F (36.8 C), temperature source Oral, resp. rate 19, height 5\' 5"  (1.651 m), weight 205 lb 9.6 oz (93.26 kg), SpO2 98.00%. GI: soft, minimal tenderness. incisions ok  Disposition:    Medication List  As of 11/03/2011  8:09 AM   ASK your doctor about these medications         ADVIL PO   Take 200-400 mg by mouth as needed. For pain. Liquid gels      ALPRAZolam 0.25 MG tablet   Commonly known as: XANAX   Take 0.25 mg by mouth 3 (three) times daily as needed. For anxiety.      escitalopram 20 MG tablet   Commonly known as: LEXAPRO   Take 20 mg by mouth daily.      escitalopram 10 MG tablet   Commonly known as: LEXAPRO   Take 10 mg by mouth daily.      ezetimibe 10 MG tablet   Commonly known as: ZETIA   Take 10 mg by mouth daily.      fluticasone 50 MCG/ACT nasal spray   Commonly known as: FLONASE   Place 4 sprays into the nose daily. 2 sprays in each nostril      lisinopril-hydrochlorothiazide 20-12.5 MG per tablet   Commonly known as: PRINZIDE,ZESTORETIC   Take 1 tablet by mouth daily.      SUMAtriptan 25 MG tablet   Commonly known as: IMITREX   Take 25 mg by mouth every 2 (two) hours as needed. For migraines.             Signed: Griselda Miner S 11/03/2011, 8:09 AM

## 2011-11-03 NOTE — Progress Notes (Signed)
2 Days Post-Op  Subjective: No complaints. Feels good  Objective: Vital signs in last 24 hours: Temp:  [98.3 F (36.8 C)-99.5 F (37.5 C)] 98.3 F (36.8 C) (04/28 0555) Pulse Rate:  [69-84] 75  (04/28 0555) Resp:  [18-20] 19  (04/28 0555) BP: (123-133)/(74-81) 128/80 mmHg (04/28 0555) SpO2:  [96 %-99 %] 98 % (04/28 0555) Last BM Date: 10/31/11  Intake/Output from previous day: 04/27 0701 - 04/28 0700 In: 3810 [P.O.:1200; I.V.:2610] Out: -  Intake/Output this shift:    GI: soft, minimal tenderness. incisions ok  Lab Results:   Basename 11/02/11 0700  WBC 7.8  HGB 11.5*  HCT 35.2*  PLT 190   BMET  Basename 11/02/11 0700  NA 139  K 4.2  CL 102  CO2 30  GLUCOSE 89  BUN 9  CREATININE 0.78  CALCIUM 9.1   PT/INR No results found for this basename: LABPROT:2,INR:2 in the last 72 hours ABG No results found for this basename: PHART:2,PCO2:2,PO2:2,HCO3:2 in the last 72 hours  Studies/Results: No results found.  Anti-infectives: Anti-infectives     Start     Dose/Rate Route Frequency Ordered Stop   10/31/11 1429   ceFAZolin (ANCEF) IVPB 2 g/50 mL premix        2 g 100 mL/hr over 30 Minutes Intravenous 60 min pre-op 10/31/11 1429 11/01/11 0725          Assessment/Plan: s/p Procedure(s) (LRB): LAPAROSCOPIC VENTRAL HERNIA (N/A) INSERTION OF MESH (N/A) Discharge  LOS: 2 days    TOTH III,Terica Yogi S 11/03/2011

## 2011-11-04 ENCOUNTER — Encounter (HOSPITAL_COMMUNITY): Payer: Self-pay | Admitting: General Surgery

## 2011-11-04 ENCOUNTER — Telehealth (INDEPENDENT_AMBULATORY_CARE_PROVIDER_SITE_OTHER): Payer: Self-pay | Admitting: General Surgery

## 2011-11-04 NOTE — Telephone Encounter (Signed)
Patient calling, taking 2 hydrocodone 5/325 every 5 hours and making sure that is okay. I made her aware that is fine if that is controlling her pain. She will call back with any other problems/questions.

## 2011-11-07 ENCOUNTER — Telehealth (INDEPENDENT_AMBULATORY_CARE_PROVIDER_SITE_OTHER): Payer: Self-pay | Admitting: General Surgery

## 2011-11-07 NOTE — Telephone Encounter (Signed)
Pt calling to discuss mild constipation.  She had VHR and was discharged home on 11/04/11.  She is passing gas but not having much urge to have BM yet.  She has been taking pain meds, which she knows has side effect of constipation.  She is also taking stool softener.  Recommended she can add Miralax to her regimen.  Pt states she will try this and will call back prn.

## 2011-11-11 ENCOUNTER — Telehealth (INDEPENDENT_AMBULATORY_CARE_PROVIDER_SITE_OTHER): Payer: Self-pay | Admitting: General Surgery

## 2011-11-11 NOTE — Telephone Encounter (Signed)
Pt calling to ask about managing pain.  She is trying to stretch out using the hydrocodone to 6 hours.  Suggested she use ibuprofen at the 3 hour mark to begin working as the hydrocodone "wears off."  She will try this.

## 2011-11-15 ENCOUNTER — Telehealth (INDEPENDENT_AMBULATORY_CARE_PROVIDER_SITE_OTHER): Payer: Self-pay | Admitting: General Surgery

## 2011-11-15 MED ORDER — OXYCODONE-ACETAMINOPHEN 10-325 MG PO TABS: 1.0000 | ORAL_TABLET | Freq: Four times a day (QID) | ORAL | Status: DC | PRN

## 2011-11-15 NOTE — Telephone Encounter (Signed)
Patient aware we are writing percocet and it will be at the front for patient pick up.

## 2011-11-15 NOTE — Telephone Encounter (Signed)
Patient is status post ventral hernia repair on 11/01/11. She is calling today with bad pain control. States she has been taking hydrocodone 5/325 one every 6 hours and called a couple days ago with bad pain control. She added advil between her doses and that helped. She then was in the car on Wednesday, they hit a pot hole and she has had uncontrolled pain since. She describes this as a cramping, twisting, tightening pain in her left lower quadrant. She states the pain is so bad it "takes her breath away" No nausea/vomiting/fevers. Moving her bowels. Please advise.

## 2011-11-15 NOTE — Telephone Encounter (Signed)
Lets write her for percocet 10/325, #50, 1 po every 4 hours

## 2011-11-20 ENCOUNTER — Encounter (INDEPENDENT_AMBULATORY_CARE_PROVIDER_SITE_OTHER): Payer: Self-pay | Admitting: General Surgery

## 2011-11-20 ENCOUNTER — Ambulatory Visit (INDEPENDENT_AMBULATORY_CARE_PROVIDER_SITE_OTHER): Payer: 59 | Admitting: General Surgery

## 2011-11-20 ENCOUNTER — Telehealth (INDEPENDENT_AMBULATORY_CARE_PROVIDER_SITE_OTHER): Payer: Self-pay | Admitting: General Surgery

## 2011-11-20 VITALS — BP 134/90 | HR 80 | Temp 97.7°F | Resp 18 | Ht 65.0 in | Wt 199.6 lb

## 2011-11-20 DIAGNOSIS — K439 Ventral hernia without obstruction or gangrene: Secondary | ICD-10-CM

## 2011-11-20 MED ORDER — OXYCODONE-ACETAMINOPHEN 5-325 MG PO TABS: 1.0000 | ORAL_TABLET | ORAL | Status: DC | PRN

## 2011-11-20 MED ORDER — CYCLOBENZAPRINE HCL 10 MG PO TABS: 10.0000 mg | ORAL_TABLET | Freq: Three times a day (TID) | ORAL | Status: DC | PRN

## 2011-11-20 NOTE — Patient Instructions (Signed)
She is having significant postoperative pain which is not unusual. She is not to return to work or lift anything heavy for at least the full 6 weeks and we will reevaluate her progress at her subsequent office visits.

## 2011-11-20 NOTE — Progress Notes (Signed)
Subjective:     Patient ID: Gabriela Torres, female   DOB: 1970/12/21, 41 y.o.   MRN: [id number]  HPI The patient is a 41 year old black female who is almost 3 weeks out from a laparoscopic ventral hernia repair with mesh. She continues to have pretty significant abdominal wall cramping and causing her pain. She denies any vomiting. She is using MiraLax for constipation but this seems to work well for her. She denies any fevers or chills.  Review of Systems     Objective:   Physical Exam On exam her abdomen is appropriately tender. Her incisions are all healing nicely with no sign of infection. She has good bowel sounds.    Assessment:     3 weeks status post laparoscopic ventral hernia repair with mesh    Plan:     At this point all right her a new prescription for Percocet for pain control. We will also try adding a muscle relaxer for her abdominal cramps. She may also get some relief with some warm compresses to her abdominal wall. I would like her to refrain from any strenuous activities. We will plan to see her back in about 2 weeks to check her progress

## 2011-11-20 NOTE — Telephone Encounter (Signed)
Left message for pt explaining that her next PO appt with Dr. Carolynne Edouard will be on 5/31 at 2:40.

## 2011-11-27 ENCOUNTER — Telehealth (INDEPENDENT_AMBULATORY_CARE_PROVIDER_SITE_OTHER): Payer: Self-pay

## 2011-11-27 NOTE — Telephone Encounter (Signed)
Patient called in to report an increase of abdominal pain, she would like to know if she can take 2 percocet every 4 hours for pain.  Spoke with Dr. Carolynne Edouard-- patient can take 2 percocet every 4 hours as needed for pain.  Patient notified.

## 2011-12-03 ENCOUNTER — Telehealth (INDEPENDENT_AMBULATORY_CARE_PROVIDER_SITE_OTHER): Payer: Self-pay | Admitting: General Surgery

## 2011-12-03 NOTE — Telephone Encounter (Signed)
Pt called after falling while walking her dog this morning.  She is s/p lap ventral hernia repair on 11/01/11, and is requesting pain meds at this time.  Updated Dr. Carolynne Edouard, who ordered Norco # 30, 1 po Q4-6H prn pain, with 1 RF.  Called med to [place] [facility]:  [phone].  Pt called to let her know and to check to see how she is feeling now that she has had time to rest at home.  She states she is better but still "all clinched up."

## 2011-12-06 ENCOUNTER — Encounter (INDEPENDENT_AMBULATORY_CARE_PROVIDER_SITE_OTHER): Payer: Self-pay | Admitting: General Surgery

## 2011-12-06 ENCOUNTER — Telehealth (INDEPENDENT_AMBULATORY_CARE_PROVIDER_SITE_OTHER): Payer: Self-pay | Admitting: General Surgery

## 2011-12-06 ENCOUNTER — Ambulatory Visit (INDEPENDENT_AMBULATORY_CARE_PROVIDER_SITE_OTHER): Payer: 59 | Admitting: General Surgery

## 2011-12-06 VITALS — BP 148/100 | HR 72 | Temp 97.2°F | Resp 18 | Ht 65.0 in | Wt 197.0 lb

## 2011-12-06 DIAGNOSIS — K439 Ventral hernia without obstruction or gangrene: Secondary | ICD-10-CM

## 2011-12-06 NOTE — Patient Instructions (Addendum)
Continue pain meds and muscle relaxers. No strenuous activity  Don't eat or drink anything 4 hours before the CT scan. Drink the first bottle of contrast 2 hours before the scan, drink the second bottle of contrast 1 hour before the scan.

## 2011-12-06 NOTE — Telephone Encounter (Signed)
LMOM to let patient know her f/u apt after her CT scan will be on 12/23/11 at 8:40.

## 2011-12-06 NOTE — Progress Notes (Signed)
Subjective:     Patient ID: Gabriela Torres, female   DOB: [birth year], 41 y.o.   MRN: [id number]  HPI The patient is a 41 year old black female who is about 5 weeks out from an laparoscopic ventral hernia repair with mesh. She has had pretty significant pain and muscle spasm since surgery. This was getting better with muscle relaxers but she recently was walking her dog and tripped over a stump and fell on her abdomen. This event his increased her pain level. She did not feel any pop during the fall. She does have some nausea but no vomiting. Her bowels are moving regularly with MiraLax.  Review of Systems     Objective:   Physical Exam On exam her abdomen is soft. There is no distention. Her incisions are all healing nicely. She is tender at her incision sites. No evidence of peritonitis. No evidence of infection    Assessment:     5 weeks status post laparoscopic ventral hernia repair with mesh    Plan:     At this point I think she's having to much pain to return to work. We will plan to get a CT scan of her abdomen and pelvis to make sure the fall but not disrupt the repair. We will see her back in about 2-3 weeks to check her progress

## 2011-12-13 ENCOUNTER — Telehealth (INDEPENDENT_AMBULATORY_CARE_PROVIDER_SITE_OTHER): Payer: Self-pay | Admitting: General Surgery

## 2011-12-13 NOTE — Telephone Encounter (Signed)
The patient called stating her pain above her umbilicus has increased and she is stating the hydrocodone and advil is not touching the pain. Offered to call [facility] imaging to have the CT done today and she refused. Advised the patient to continue with her medication regime prescribed by Dr Carolynne Edouard and use a heating pad on the area, patient states that Dr Carolynne Edouard also told her to use a heating pad at the last visit and she has not been compliant with this. The patient states she stopped taking the flexeril prescribed last week and during our conversation it was determined that the pain has increased since she discontinued it. Patient advised to continue with her flexeril.

## 2011-12-19 ENCOUNTER — Ambulatory Visit
Admission: RE | Admit: 2011-12-19 | Discharge: 2011-12-19 | Disposition: A | Discharge status: Home / Self Care | Payer: 59 | Source: Ambulatory Visit | Attending: General Surgery | Admitting: General Surgery

## 2011-12-19 ENCOUNTER — Telehealth (INDEPENDENT_AMBULATORY_CARE_PROVIDER_SITE_OTHER): Payer: Self-pay

## 2011-12-19 DIAGNOSIS — K439 Ventral hernia without obstruction or gangrene: Secondary | ICD-10-CM

## 2011-12-19 MED ORDER — IOHEXOL 300 MG/ML  SOLN
100.0000 mL | Freq: Once | INTRAMUSCULAR | Status: AC | PRN
  Administered 2011-12-19: 100 mL via INTRAVENOUS

## 2011-12-19 NOTE — Telephone Encounter (Signed)
Spoke with patient and informed her that I had Dr. Michaell Cowing review her CT and there doesn't seem to be an abscess where her surgery was and there is no recurrent hernia.  I did also inform her that the CT showed an increased in size right ovarian cyst and let her know that she would need to follow up with her GYn if she wanted to pursue that.  I let the patient know that she needed to use warm compresses and take an anti-inflammatory to help ease the pain and discomfort.  Patient seemed happy with this and I let her know that we would continue to see her on 6/17 with Dr. Carolynne Edouard.

## 2011-12-19 NOTE — Telephone Encounter (Signed)
Patient called in reporting pain after abd CT, she reports upset stomach feel's as though the contrast caused her discomfort.  CT results printed out and given to Urgent Office for review (Dr. Carolynne Edouard not available to page) will notify patient after physician review.

## 2011-12-23 ENCOUNTER — Encounter (INDEPENDENT_AMBULATORY_CARE_PROVIDER_SITE_OTHER): Payer: Self-pay | Admitting: General Surgery

## 2011-12-23 ENCOUNTER — Ambulatory Visit (INDEPENDENT_AMBULATORY_CARE_PROVIDER_SITE_OTHER): Payer: 59 | Admitting: General Surgery

## 2011-12-23 VITALS — BP 128/100 | HR 66 | Temp 97.0°F | Resp 14 | Ht 65.0 in | Wt 199.8 lb

## 2011-12-23 DIAGNOSIS — K439 Ventral hernia without obstruction or gangrene: Secondary | ICD-10-CM

## 2011-12-23 NOTE — Patient Instructions (Signed)
Try to avoid heavy lifting

## 2011-12-24 ENCOUNTER — Encounter (INDEPENDENT_AMBULATORY_CARE_PROVIDER_SITE_OTHER): Payer: Self-pay | Admitting: General Surgery

## 2011-12-24 NOTE — Progress Notes (Signed)
Subjective:     Patient ID: Gabriela Torres, female   DOB: 01/30/1971, 41 y.o.   MRN: [id number]  HPI The patient is a 41 year old white female who is about 2 months out from a laparoscopic ventral hernia repair with mesh. She is still having pretty significant pain. Her appetite is good and her bowels are working normally. She denies any fevers or chills.  Review of Systems     Objective:   Physical Exam On exam her abdomen is soft but she is quite tender at each of the incision sites. Her incisions are healing nicely with no sign of infection. There is no distention. Her recent CT scan showed no complication of her hernia repair    Assessment:     2 months status post laparoscopic ventral hernia repair with mesh    Plan:     At this point I'm not sure that she is ready to return to work because of her pain level. I would Like to keep her out about another 3 or 4 weeks to see if this will give her enough time to help her pain so that she can return to work. We'll see her back in one month to check her progress.

## 2011-12-27 ENCOUNTER — Telehealth (INDEPENDENT_AMBULATORY_CARE_PROVIDER_SITE_OTHER): Payer: Self-pay | Admitting: General Surgery

## 2011-12-27 ENCOUNTER — Telehealth (INDEPENDENT_AMBULATORY_CARE_PROVIDER_SITE_OTHER): Payer: Self-pay

## 2011-12-27 NOTE — Telephone Encounter (Signed)
Patient called reporting she's been experiencing nausea, she denies fever, vomiting or pain.  Patient said she was having bowel movements every 2-3 days.  She cannot associate the cause of her nausea, she states it's continuous.  Patient would like to know if we would prescribe something for nausea or any recommendations.  Gabriela Torres's pharmacy is [facility] in [place] [phone].

## 2011-12-27 NOTE — Telephone Encounter (Signed)
Called patient to let her know that I called in a Rx per Dr. Carolynne Edouard for phenergan 12.5mg  1 PO every 8 hours prn.Marland KitchenMarland KitchenMarland Kitchen#10 w/ no refills.

## 2012-01-15 ENCOUNTER — Telehealth (INDEPENDENT_AMBULATORY_CARE_PROVIDER_SITE_OTHER): Payer: Self-pay

## 2012-01-15 NOTE — Telephone Encounter (Signed)
The patient called requesting more antinausea med.  She got Phenergen 12.5mg  #10 1 po q8hrs prn/ no refill.  I got the ok from Dr Carolynne Edouard and called the refill in to [facility] [phone].  The pt is aware

## 2012-02-04 ENCOUNTER — Encounter (INDEPENDENT_AMBULATORY_CARE_PROVIDER_SITE_OTHER): Payer: Self-pay | Admitting: General Surgery

## 2012-02-04 ENCOUNTER — Ambulatory Visit (INDEPENDENT_AMBULATORY_CARE_PROVIDER_SITE_OTHER): Payer: 59 | Admitting: General Surgery

## 2012-02-04 VITALS — BP 126/80 | HR 79 | Temp 97.7°F | Resp 14 | Ht 65.0 in | Wt 193.4 lb

## 2012-02-04 DIAGNOSIS — K439 Ventral hernia without obstruction or gangrene: Secondary | ICD-10-CM

## 2012-02-04 MED ORDER — PROMETHAZINE HCL 12.5 MG PO TABS: 12.5000 mg | ORAL_TABLET | Freq: Four times a day (QID) | ORAL | Status: DC | PRN

## 2012-02-04 NOTE — Patient Instructions (Signed)
May return to work tomorrow

## 2012-02-04 NOTE — Progress Notes (Signed)
Subjective:     Patient ID: Gabriela Torres, female   DOB: Aug 05, 1970, 41 y.o.   MRN: [id number]  HPI The patient is a 41 year old white female who is about 3 months out from a laparoscopic ventral hernia repair with mesh. Her postoperative course has been complicated by persistent abdominal pain. She actually states that she is feeling better now. She still has some soreness of her abdominal wall but she no longer requires any narcotic pain medicine. Her main complaint now is of nausea. She is able to eat and is having bowel movements. Unfortunately she has a bowel movement about once a week.  Review of Systems     Objective:   Physical Exam On exam her abdomen is soft with just mild tenderness now. Her incisions are all healing nicely. Her abdominal wall feels very solid with no palpable evidence for recurrence of her hernia    Assessment:     3 months status post laparoscopic ventral hernia repair with mesh    Plan:     At this point I think she can return to her normal activities without any restrictions. We will write her a note to return to work tomorrow. We'll plan to see her back in about 2 months just to check her progress.

## 2012-04-22 ENCOUNTER — Encounter (INDEPENDENT_AMBULATORY_CARE_PROVIDER_SITE_OTHER): Payer: Self-pay | Admitting: General Surgery

## 2012-04-22 ENCOUNTER — Ambulatory Visit (INDEPENDENT_AMBULATORY_CARE_PROVIDER_SITE_OTHER): Payer: 59 | Admitting: General Surgery

## 2012-04-22 VITALS — BP 136/76 | HR 81 | Temp 98.0°F | Resp 18 | Ht 65.0 in | Wt 199.8 lb

## 2012-04-22 DIAGNOSIS — K439 Ventral hernia without obstruction or gangrene: Secondary | ICD-10-CM

## 2012-04-22 NOTE — Patient Instructions (Signed)
May return to all normal activities 

## 2012-04-23 NOTE — Progress Notes (Signed)
Subjective:     Patient ID: Gabriela Torres, female   DOB: May 15, 1971, 41 y.o.   MRN: [id number]  HPI The pt is a 41 yo bf who is 42 months s/p lap ventral hernia repair with mesh. She states that she still has some soreness but is doing much better. She also notes that she quit using miralax and is only having a bowel movement about once a week.  Review of Systems     Objective:   Physical Exam On exam her abdomen is soft and nontender. Her incisions have healed nicely. She has no palpable evidence for recurrence of her hernia and her abdominal wall feels very solid    Assessment:     6 months s/p lap ventral hernia repair with mesh    Plan:     At this point she can return to all her normal acitivities without any restriction and we will plan to see her back on a prn basis

## 2012-05-18 ENCOUNTER — Ambulatory Visit: Payer: 59

## 2012-05-18 ENCOUNTER — Ambulatory Visit (INDEPENDENT_AMBULATORY_CARE_PROVIDER_SITE_OTHER): Payer: 59 | Admitting: Family Medicine

## 2012-05-18 VITALS — BP 110/75 | HR 81 | Temp 98.8°F | Resp 16

## 2012-05-18 DIAGNOSIS — IMO0002 Reserved for concepts with insufficient information to code with codable children: Secondary | ICD-10-CM

## 2012-05-18 DIAGNOSIS — M792 Neuralgia and neuritis, unspecified: Secondary | ICD-10-CM

## 2012-05-18 DIAGNOSIS — E785 Hyperlipidemia, unspecified: Secondary | ICD-10-CM | POA: Insufficient documentation

## 2012-05-18 DIAGNOSIS — I10 Essential (primary) hypertension: Secondary | ICD-10-CM | POA: Insufficient documentation

## 2012-05-18 DIAGNOSIS — R079 Chest pain, unspecified: Secondary | ICD-10-CM

## 2012-05-18 NOTE — Progress Notes (Addendum)
[facility] [address] [place] [address] [phone]  Date:  05/18/2012   Name:  Gabriela Torres   DOB:  07/03/1971   MRN:  [id number]  PCP:  Dois Davenport., MD    Chief Complaint: Arm Pain and Chest Pain   History of Present Illness:  Gabriela Torres is a 41 y.o. very pleasant female patient who presents with the following:  Last night around 6pm she noted onset of a sharp, stabbing type pain in her anterior/ left chest wall.  It seemed to be related to movement and she "did not think it was my heart."  She finally did get to sleep last night and she felt better this morning.  However, while at work she noted some numbness in her left arm around 8am.  It is not as bad as it was this morning and has waxed and waned today. The arm feels a little bit weak to her.  Her hand feels tingly- however this tingling has actually been going on for some time and she wonders if she might have CTS.   She still does note some pain in her chest if she raises her left arm or rolls over in bed.  However, she does not have acute CP now.  No SOB.  She has never had this problem in the past.   Quit smoking in the 1980s.    She takes lisinopril/ hctz, lexapro and occasionally xanax.  The other medications on her list are left over from her hernia surgery and are not current She did a 5k run on Saturday, but this is not unusual for her.  She did not fall or have any injury. No unusual activities.  She runs several miles a few times a week without any trouble.   She is s/p hysterectomy. She has never had a PE or DVT.  Her brother did have a PE but he was hospitalized at the time with pneumonia. There is no family history of heart problems that she knows of.   No other numbness.  No unusual HA, no slurred speech or difficulty chewing, no facial droop  Patient Active Problem List  Diagnosis  . Ventral hernia    Past Medical History  Diagnosis Date  . Abdominal wall hernia     . Hypertension   . Hyperlipidemia   . Abdominal pain   . Hearing loss   . Migraines   . Anxiety     Past Surgical History  Procedure Date  . Tonsillectomy 2008  . Nasal turbinate reduction 2000  . Abdominal hysterectomy 09/2003 or 09/2004     total   . Ventral hernia repair 11/01/2011    Procedure: LAPAROSCOPIC VENTRAL HERNIA;  Surgeon: Robyne Askew, MD;  Location: [facility] OR;  Service: General;  Laterality: N/A;  . Hernia repair     History  Substance Use Topics  . Smoking status: Former Smoker -- 0.2 packs/day for 1 years    Types: Cigarettes    Quit date: 10/24/1981  . Smokeless tobacco: Never Used     Comment: social drinker  . Alcohol Use: Yes     Comment: 3 - 4 per week    Family History  Problem Relation Age of Onset  . Cancer Mother     abdominal  . Cancer Father     colon  . Heart disease Sister     chf    Allergies  Allergen Reactions  . Statins Other (  See Comments)    Left lower and rear flank pain.  . Food Nausea Only    Dill, rye  . Guaifenesin & Derivatives Nausea And Vomiting    Medication list has been reviewed and updated.  Current Outpatient Prescriptions on File Prior to Visit  Medication Sig Dispense Refill  . ALPRAZolam (XANAX) 0.25 MG tablet Take 0.25 mg by mouth 3 (three) times daily as needed. For anxiety.      Marland Kitchen escitalopram (LEXAPRO) 10 MG tablet Take 10 mg by mouth daily.      Marland Kitchen escitalopram (LEXAPRO) 20 MG tablet Take 20 mg by mouth daily.      Marland Kitchen ezetimibe (ZETIA) 10 MG tablet Take 10 mg by mouth daily.      . fluticasone (FLONASE) 50 MCG/ACT nasal spray Place 4 sprays into the nose daily. 2 sprays in each nostril      . Ibuprofen (ADVIL PO) Take 200-400 mg by mouth as needed. For pain. Liquid gels      . lisinopril-hydrochlorothiazide (PRINZIDE,ZESTORETIC) 20-12.5 MG per tablet Take 1 tablet by mouth daily.      . cyclobenzaprine (FLEXERIL) 10 MG tablet Take 1 tablet (10 mg total) by mouth every 8 (eight) hours as needed for muscle  spasms.  30 tablet  1  . CYCLOBENZAPRINE HCL PO Ad lib.      Marland Kitchen oxyCODONE-acetaminophen (PERCOCET) 10-325 MG per tablet Take 1 tablet by mouth every 6 (six) hours as needed for pain.  50 tablet  0  . oxyCODONE-acetaminophen (ROXICET) 5-325 MG per tablet Take 1 tablet by mouth every 4 (four) hours as needed for pain.  50 tablet  0  . promethazine (PHENERGAN) 12.5 MG tablet Take 12.5 mg by mouth every 6 (six) hours as needed.      . SUMAtriptan (IMITREX) 25 MG tablet Take 25 mg by mouth every 2 (two) hours as needed. For migraines.        Review of Systems:  As per HPI- otherwise negative.   Physical Examination: Filed Vitals:   05/18/12 1631  BP: 110/75  Pulse: 81  Temp: 98.8 F (37.1 C)  Resp: 16   There were no vitals filed for this visit. There is no height or weight on file to calculate BMI. Ideal Body Weight:    GEN: WDWN, NAD, Non-toxic, A & O x 3, overweight.  Looks well and is in no distress.  HEENT: Atraumatic, Normocephalic. Neck supple. No masses, No LAD.   Ears and Nose: No external deformity. CV: RRR, No M/G/R. No JVD. No thrill. No extra heart sounds. She does have reproducible CP with pressing on her chest wall. She is quite tender to this maneuver.  PULM: CTA B, no wheezes, crackles, rhonchi. No retractions. No resp. distress. No accessory muscle use. ABD: S, NT, ND EXTR: No c/c/e.  Left arm does not show any swelling or tenderness to suggest DVT.  She has normal motion of the left shoulder.   NEURO Normal gait. She has normal and equal strength and sensation of touch in both arms. I am not able to appreciate swelling of the left hand.  She does feel that she has some tingling in the left arm and hand.  However, she has 5/5 strength.   Normal cervical exam and ROM.   Legs with normal strength and sensation PSYCH: Normally interactive. Conversant. Not depressed or anxious appearing.  Calm demeanor.   [facility] reading (PRIMARY) by  Dr. Patsy Lager.    CXR:  normal Cervical spine:  Some loss of lordosis, otherwise normal EKG: compared to old EKG done for pre-op evaluation in April 2013. She has more significant ST elevation in her lateral leads than was present in the past. This may be early repolarization  CHEST - 2 VIEW  Comparison: None  Findings: The heart size and mediastinal contours are within normal limits. Both lungs are clear. The visualized skeletal structures are unremarkable.  IMPRESSION: Negative exam.  CERVICAL SPINE - COMPLETE 4+ VIEW  Comparison: None.  Findings: The cervical spine is visualized from the occiput to the cervicothoracic junction. There is straightening of the normal cervical lordosis. Alignment is otherwise anatomic. Vertebral body and disc space height are maintained. No significant degenerative changes. Prevertebral soft tissues are grossly within normal limits. Dens is partially obscured on the dedicated view. Neural foramen appear grossly patent. Visualized lung apices appear clear.  IMPRESSION: Straightening of the normal cervical lordosis.  Asa 81 mg #4 given at 5:20 pm Assessment and Plan: 1. Chest pain  DG Chest 2 View, EKG 12-Lead  2. Radicular pain in left arm  DG Cervical Spine Complete   Gabriela Torres has a cluster of symptoms today. I was concerned about her EKG- likely early repol, but there is some change from previous (EKG done in April prior to her hernia operation). Discussed doing a cardiac rule- out at the ED, but Gabriela Torres really did not want to do this.  Was able to have MD on call for [facility] cardiology look at her EKG.  He felt that she did not have significant change and that her ST elevation is consistent with early repol changes.  However, cautioned Gabriela Torres that the only way to know for sure that she is not having ischemic pain is to do a chest pain rule- out.  Also, explained that while her left arm symptoms may be due to MSK pain, there is also the possibility of an upper extremity  DVT or even a CVA. Recommended that we proceed with ED evaluation as I do not have the resources to rule- out ischemic pain, DVT or CVA here at clinic.   Gabriela Torres declined to go to the ED at this time.  She plans to go home and have a friend or family member monitor her. She did agree to proceed to the ED if her symptoms change or get worse.  I will call and check on her tomorrow. We hope that her symptoms are just due to MSK pain, and she may be having CTS of the left hand.  Advised her to try an OTC wrist splint at night.  Otherwise will check in with her tomorrow.    COPLAND,JESSICA, MD  Called her to discuss on 11/12- she is feeling better.  Numbness is much better, and she is sure that her CP is MSK as it is positional and occurs with movement/ stretching of her chest wall.  She will let us know if any problems arise

## 2012-05-19 ENCOUNTER — Telehealth: Payer: Self-pay | Admitting: Family Medicine

## 2012-05-19 NOTE — Telephone Encounter (Signed)
Called to check on her- no answer so [facility]

## 2012-07-13 ENCOUNTER — Ambulatory Visit: Payer: Managed Care, Other (non HMO)

## 2012-07-13 ENCOUNTER — Ambulatory Visit (INDEPENDENT_AMBULATORY_CARE_PROVIDER_SITE_OTHER): Payer: Managed Care, Other (non HMO) | Admitting: Family Medicine

## 2012-07-13 VITALS — BP 142/82 | HR 81 | Temp 98.0°F | Resp 16 | Ht 65.0 in | Wt 210.0 lb

## 2012-07-13 DIAGNOSIS — K439 Ventral hernia without obstruction or gangrene: Secondary | ICD-10-CM

## 2012-07-13 DIAGNOSIS — M542 Cervicalgia: Secondary | ICD-10-CM

## 2012-07-13 DIAGNOSIS — E7800 Pure hypercholesterolemia, unspecified: Secondary | ICD-10-CM

## 2012-07-13 DIAGNOSIS — I10 Essential (primary) hypertension: Secondary | ICD-10-CM

## 2012-07-13 DIAGNOSIS — M549 Dorsalgia, unspecified: Secondary | ICD-10-CM

## 2012-07-13 LAB — POCT CBC
Granulocyte percent: 51.4 %G (ref 37–80)
HCT, POC: 41.6 % (ref 37.7–47.9)
MCV: 80.8 fL (ref 80–97)
MID (cbc): 0.5 (ref 0–0.9)
POC Granulocyte: 4.2 (ref 2–6.9)
Platelet Count, POC: 268 10*3/uL (ref 142–424)
RBC: 5.15 M/uL (ref 4.04–5.48)
RDW, POC: 15.5 %

## 2012-07-13 MED ORDER — CYCLOBENZAPRINE HCL 10 MG PO TABS: 10.0000 mg | ORAL_TABLET | Freq: Three times a day (TID) | ORAL | Status: AC | PRN

## 2012-07-13 MED ORDER — LISINOPRIL-HYDROCHLOROTHIAZIDE 20-12.5 MG PO TABS: 1.0000 | ORAL_TABLET | Freq: Every day | ORAL | Status: DC

## 2012-07-13 NOTE — Patient Instructions (Addendum)
Use the flexeril as needed, and wear your neck collar as needed for comfort.  Let me know if your neck and shoulder do not feel better in the next few days- Sooner if worse.
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
## 2012-07-14 ENCOUNTER — Telehealth: Payer: Self-pay

## 2012-07-14 ENCOUNTER — Other Ambulatory Visit: Payer: Self-pay | Admitting: Family Medicine

## 2012-07-14 MED ORDER — NAPROXEN 500 MG PO TABS: 500.0000 mg | ORAL_TABLET | Freq: Two times a day (BID) | ORAL | Status: DC

## 2012-07-14 NOTE — Telephone Encounter (Signed)
Continue the flexeril and add naprosyn which I just sent to the pharmacy

## 2012-07-14 NOTE — Telephone Encounter (Signed)
Called patient what med is she referring to? She was seen yesterday and states the Flexeril helped neck pain but her back pain is worse. Please advise.

## 2012-07-14 NOTE — Telephone Encounter (Signed)
Thanks, I have advised patient.  

## 2012-07-14 NOTE — Telephone Encounter (Signed)
PT STATES DR COPLAND WANTED HER TO CALL IF THE MEDICINE WASN'T HELPING AND IT ISN'T. THINK SHE MAY NEED SOMETHING ELSE PLEASE CALL [phone]    [facility] ON [address] IN [place]

## 2012-07-22 ENCOUNTER — Encounter: Payer: Self-pay | Admitting: Family Medicine

## 2012-07-22 LAB — COMPREHENSIVE METABOLIC PANEL
ALT: 11 IU/L (ref 0–32)
Albumin: 4.3 g/dL (ref 3.5–5.5)
BUN: 19 mg/dL (ref 6–24)
Calcium: 9.1 mg/dL (ref 8.7–10.2)
Chloride: 101 mmol/L (ref 97–108)
GFR calc Af Amer: 114 mL/min/{1.73_m2} (ref >59)
Glucose: 85 mg/dL (ref 65–99)
Potassium: 4.1 mmol/L (ref 3.5–5.2)
Total Bilirubin: 0.2 mg/dL (ref 0.0–1.2)
Total Protein: 7.3 g/dL (ref 6.0–8.5)

## 2012-07-22 LAB — LIPID PANEL W/O CHOL/HDL RATIO
Cholesterol, Total: 266 mg/dL — ABNORMAL HIGH (ref 100–199)
HDL: 45 mg/dL (ref >39)
LDL Calculated: 189 mg/dL — ABNORMAL HIGH (ref 0–99)
Triglycerides: 158 mg/dL — ABNORMAL HIGH (ref 0–149)
VLDL Cholesterol Cal: 32 mg/dL (ref 5–40)

## 2012-07-22 LAB — PLEASE NOTE

## 2012-12-02 ENCOUNTER — Ambulatory Visit (INDEPENDENT_AMBULATORY_CARE_PROVIDER_SITE_OTHER): Payer: Managed Care, Other (non HMO) | Admitting: Family Medicine

## 2012-12-02 VITALS — BP 140/82 | HR 80 | Temp 99.0°F | Resp 16 | Ht 64.5 in | Wt 225.2 lb

## 2012-12-02 DIAGNOSIS — H109 Unspecified conjunctivitis: Secondary | ICD-10-CM

## 2012-12-02 MED ORDER — ERYTHROMYCIN 5 MG/GM OP OINT: TOPICAL_OINTMENT | OPHTHALMIC | Status: DC

## 2012-12-02 NOTE — Progress Notes (Signed)
[facility] [address] [place] [address] [phone]  Date:  12/02/2012   Name:  Gabriela Torres   DOB:  1970-09-21   MRN:  [id number]  PCP:  Abbe Amsterdam, MD    Chief Complaint: Eye Pain   History of Present Illness:  Gabriela Torres is a 42 y.o. very pleasant female patient who presents with the following:  Here today with a left eye problem.  This morning after she got to work she noticed minor irritation that seemed to get worse as the day went on. She has a lot of eye discharge today. "I'm constantly wiping with a tissue."  The eye burns some, and feels itchy and hot.   No photophoabia.    She has noted that she could not see as well as usual on her computer at work today.   However, she had not noted any gross change in her vision.   She does not wear glasses or contacts.    Otherwise she is feeling well. No fever, ST< etc.    Patient Active Problem List   Diagnosis Date Noted  . HTN (hypertension) 05/18/2012  . Hyperlipidemia 05/18/2012  . Ventral hernia 09/03/2011    Past Medical History  Diagnosis Date  . Abdominal wall hernia   . Hypertension   . Hyperlipidemia   . Abdominal pain   . Hearing loss   . Migraines   . Anxiety     Past Surgical History  Procedure Laterality Date  . Tonsillectomy  2008  . Nasal turbinate reduction  2000  . Abdominal hysterectomy  09/2003 or 09/2004     total   . Ventral hernia repair  11/01/2011    Procedure: LAPAROSCOPIC VENTRAL HERNIA;  Surgeon: Robyne Askew, MD;  Location: [facility] OR;  Service: General;  Laterality: N/A;  . Hernia repair      History  Substance Use Topics  . Smoking status: Former Smoker -- 0.25 packs/day for 1 years    Types: Cigarettes    Quit date: 10/24/1981  . Smokeless tobacco: Never Used     Comment: social drinker  . Alcohol Use: Yes     Comment: 3 - 4 per week    Family History  Problem Relation Age of Onset  . Cancer Mother     abdominal  . Cancer Father      colon  . Heart disease Sister     chf    Allergies  Allergen Reactions  . Statins Other (See Comments)    Left lower and rear flank pain.  . Food Nausea Only    Dill, rye  . Guaifenesin & Derivatives Nausea And Vomiting    Medication list has been reviewed and updated.  Current Outpatient Prescriptions on File Prior to Visit  Medication Sig Dispense Refill  . ALPRAZolam (XANAX) 0.25 MG tablet Take 0.25 mg by mouth 3 (three) times daily as needed. For anxiety.      . cyclobenzaprine (FLEXERIL) 10 MG tablet Take 1 tablet (10 mg total) by mouth every 8 (eight) hours as needed for muscle spasms.  30 tablet  1  . escitalopram (LEXAPRO) 20 MG tablet Take 20 mg by mouth daily.      Marland Kitchen ezetimibe (ZETIA) 10 MG tablet Take 10 mg by mouth daily.      . fluticasone (FLONASE) 50 MCG/ACT nasal spray Place 4 sprays into the nose daily. 2 sprays in each nostril      . Ibuprofen (  ADVIL PO) Take 200-400 mg by mouth as needed. For pain. Liquid gels      . lisinopril-hydrochlorothiazide (PRINZIDE,ZESTORETIC) 20-12.5 MG per tablet Take 1 tablet by mouth daily.  90 tablet  3  . naproxen (NAPROSYN) 500 MG tablet Take 1 tablet (500 mg total) by mouth 2 (two) times daily with a meal.  60 tablet  0  . promethazine (PHENERGAN) 12.5 MG tablet Take 12.5 mg by mouth every 6 (six) hours as needed.      . SUMAtriptan (IMITREX) 25 MG tablet Take 25 mg by mouth every 2 (two) hours as needed. For migraines.      Marland Kitchen escitalopram (LEXAPRO) 10 MG tablet Take 10 mg by mouth daily.       No current facility-administered medications on file prior to visit.    Review of Systems:  As per HPI- otherwise negative.   Physical Examination: Filed Vitals:   12/02/12 1349  BP: 140/82  Pulse: 80  Temp: 99 F (37.2 C)  Resp: 16   Filed Vitals:   12/02/12 1349  Height: 5' 4.5" (1.638 m)  Weight: 225 lb 3.2 oz (102.15 kg)   Body mass index is 38.07 kg/(m^2). Ideal Body Weight: Weight in (lb) to have BMI = 25:  147.6  GEN: WDWN, NAD, Non-toxic, A & O x 3, overweight HEENT: Atraumatic, Normocephalic. Neck supple. No masses, No LAD.  Bilateral TM wnl, oropharynx normal.  PEERL,EOMI.   Left eye is injected inferiorly, and the conjunctivae are inflamed.  Fundoscopic exam wnl.  No pain with eye movement Left eye is watering, thin, sticky discharge fluorescin stain: irritation of the inferior eye, but no ulcer or abrasion.   Ears and Nose: No external deformity. CV: RRR, No M/G/R. No JVD. No thrill. No extra heart sounds. PULM: CTA B, no wheezes, crackles, rhonchi. No retractions. No resp. distress. No accessory muscle use. EXTR: No c/c/e NEURO Normal gait.  PSYCH: Normally interactive. Conversant. Not depressed or anxious appearing.  Calm demeanor.    Assessment and Plan: Conjunctivitis - Plan: erythromycin ophthalmic ointment  Conjunctivitis.  Her eye is fairly inflamed today and her vision is mildly decreased in the left eye. Plan a recheck tomorrow am to ensure she is improving.  Will use an abx ointment for lubrication.    Signed Abbe Amsterdam, MD

## 2012-12-02 NOTE — Patient Instructions (Addendum)
Please come and see me tomorrow and we will recheck your eye.  If you get worse overnight please call or come in to the ER.

## 2012-12-03 ENCOUNTER — Ambulatory Visit (INDEPENDENT_AMBULATORY_CARE_PROVIDER_SITE_OTHER): Payer: Managed Care, Other (non HMO) | Admitting: Family Medicine

## 2012-12-03 VITALS — BP 142/90 | HR 85 | Temp 98.2°F | Resp 16 | Ht 65.5 in | Wt 224.0 lb

## 2012-12-03 DIAGNOSIS — H109 Unspecified conjunctivitis: Secondary | ICD-10-CM

## 2012-12-03 NOTE — Patient Instructions (Addendum)
Please go to [employer] [employer] today at 1:30 pm [address] [phone]  We will fax your records there.   Take care and let me know if there is anything that you need

## 2012-12-03 NOTE — Progress Notes (Signed)
[facility] [address] [place] [address] [phone]  Date:  12/03/2012   Name:  Gabriela Torres   DOB:  1970-08-15   MRN:  [id number]  PCP:  Abbe Amsterdam, MD    Chief Complaint: Follow-up   History of Present Illness:  Gabriela Torres is a 42 y.o. very pleasant female patient who presents with the following:  Here today to recheck her left eye.   Seen here yesterday with about 8 hours of symptoms irritation and discharge of the left eye. Started antibiotic ointment.  Asked to come in today for a recheck. She did worse on her vision check today- in each eye and both eyes together, compared with yesterday.  She notes that her left eye is now more painful, and her right eye is also irritated.  She has mild photophobia bilaterally and prefers to wear her sunglasses.    Patient Active Problem List   Diagnosis Date Noted  . HTN (hypertension) 05/18/2012  . Hyperlipidemia 05/18/2012  . Ventral hernia 09/03/2011    Past Medical History  Diagnosis Date  . Abdominal wall hernia   . Hypertension   . Hyperlipidemia   . Abdominal pain   . Hearing loss   . Migraines   . Anxiety     Past Surgical History  Procedure Laterality Date  . Tonsillectomy  2008  . Nasal turbinate reduction  2000  . Abdominal hysterectomy  09/2003 or 09/2004     total   . Ventral hernia repair  11/01/2011    Procedure: LAPAROSCOPIC VENTRAL HERNIA;  Surgeon: Robyne Askew, MD;  Location: [facility] OR;  Service: General;  Laterality: N/A;  . Hernia repair      History  Substance Use Topics  . Smoking status: Former Smoker -- 0.25 packs/day for 1 years    Types: Cigarettes    Quit date: 10/24/1981  . Smokeless tobacco: Never Used     Comment: social drinker  . Alcohol Use: Yes     Comment: 3 - 4 per week    Family History  Problem Relation Age of Onset  . Cancer Mother     abdominal  . Cancer Father     colon  . Heart disease Sister     chf    Allergies  Allergen  Reactions  . Statins Other (See Comments)    Left lower and rear flank pain.  . Food Nausea Only    Dill, rye  . Guaifenesin & Derivatives Nausea And Vomiting    Medication list has been reviewed and updated.  Current Outpatient Prescriptions on File Prior to Visit  Medication Sig Dispense Refill  . ALPRAZolam (XANAX) 0.25 MG tablet Take 0.25 mg by mouth 3 (three) times daily as needed. For anxiety.      . cyclobenzaprine (FLEXERIL) 10 MG tablet Take 1 tablet (10 mg total) by mouth every 8 (eight) hours as needed for muscle spasms.  30 tablet  1  . erythromycin ophthalmic ointment Apply a one centimeter ribbon to the left eye up to 6x a day for one week  3.5 g  0  . escitalopram (LEXAPRO) 10 MG tablet Take 10 mg by mouth daily.      Marland Kitchen escitalopram (LEXAPRO) 20 MG tablet Take 20 mg by mouth daily.      Marland Kitchen ezetimibe (ZETIA) 10 MG tablet Take 10 mg by mouth daily.      . fluticasone (FLONASE) 50 MCG/ACT nasal spray Place 4 sprays  into the nose daily. 2 sprays in each nostril      . Ibuprofen (ADVIL PO) Take 200-400 mg by mouth as needed. For pain. Liquid gels      . lisinopril-hydrochlorothiazide (PRINZIDE,ZESTORETIC) 20-12.5 MG per tablet Take 1 tablet by mouth daily.  90 tablet  3  . naproxen (NAPROSYN) 500 MG tablet Take 1 tablet (500 mg total) by mouth 2 (two) times daily with a meal.  60 tablet  0  . promethazine (PHENERGAN) 12.5 MG tablet Take 12.5 mg by mouth every 6 (six) hours as needed.      . SUMAtriptan (IMITREX) 25 MG tablet Take 25 mg by mouth every 2 (two) hours as needed. For migraines.       No current facility-administered medications on file prior to visit.    Review of Systems:  As per HPI- otherwise negative.   Physical Examination: Filed Vitals:   12/03/12 0936  Pulse: 85  Temp: 98.2 F (36.8 C)  Resp: 16   Filed Vitals:   12/03/12 0936  Height: 5' 5.5" (1.664 m)  Weight: 224 lb (101.606 kg)   Body mass index is 36.7 kg/(m^2). Ideal Body Weight: Weight  in (lb) to have BMI = 25: 152.2  GEN: WDWN, NAD, Non-toxic, A & O x 3 HEENT: Atraumatic, Normocephalic. Neck supple. No masses, No LAD. Both eyes are now injected and irritated with clear discharge.  PEERL, EOMI.  No pain with extraocular movement.   Ears and Nose: No external deformity. EXTR: No c/c/e NEURO Normal gait.  PSYCH: Normally interactive. Conversant. Not depressed or anxious appearing.  Calm demeanor.    Assessment and Plan: Conjunctivitis  Likely conjunctivitis, but has not improved with abx and sx now spread to the right eye.  ?viral conjunctivitis.  Will have her see optho today- Dr. Dione Booze will see her at 1:30 pm today, we appreciate his consultation very much.    Signed Abbe Amsterdam, MD

## 2013-01-01 ENCOUNTER — Encounter: Payer: Self-pay | Admitting: Family Medicine

## 2013-03-10 ENCOUNTER — Telehealth: Payer: Self-pay

## 2013-03-10 DIAGNOSIS — I10 Essential (primary) hypertension: Secondary | ICD-10-CM

## 2013-03-10 MED ORDER — LISINOPRIL-HYDROCHLOROTHIAZIDE 20-12.5 MG PO TABS: 1.0000 | ORAL_TABLET | Freq: Every day | ORAL | Status: DC

## 2013-03-10 NOTE — Telephone Encounter (Signed)
PT STATES SHE HAVE BEEN OUT OF HER LISINOPRIL FOR OVER A WEEK NOW AND USE TO USE THE MAIL ORDER, BUT DOESN'T ANYMORE. PLEASE CALL [phone]    [facility] [address] IN [place]

## 2013-03-10 NOTE — Telephone Encounter (Signed)
Sent in 1 mo supply, pt needs appt for additional meds.

## 2013-03-11 ENCOUNTER — Telehealth: Payer: Self-pay

## 2013-04-16 ENCOUNTER — Ambulatory Visit: Payer: Self-pay | Admitting: Physician Assistant

## 2013-04-16 ENCOUNTER — Encounter: Payer: Self-pay | Admitting: Physician Assistant

## 2013-04-16 VITALS — BP 134/82 | HR 74 | Temp 98.8°F | Resp 16 | Ht 66.0 in | Wt 219.0 lb

## 2013-04-16 DIAGNOSIS — G43909 Migraine, unspecified, not intractable, without status migrainosus: Secondary | ICD-10-CM

## 2013-04-16 DIAGNOSIS — Z23 Encounter for immunization: Secondary | ICD-10-CM

## 2013-04-16 DIAGNOSIS — E785 Hyperlipidemia, unspecified: Secondary | ICD-10-CM

## 2013-04-16 DIAGNOSIS — I10 Essential (primary) hypertension: Secondary | ICD-10-CM

## 2013-04-16 LAB — LIPID PANEL: HDL: 45 mg/dL (ref >39)

## 2013-04-16 LAB — COMPREHENSIVE METABOLIC PANEL
AST: 12 U/L (ref 0–37)
Albumin: 4.4 g/dL (ref 3.5–5.2)
BUN: 16 mg/dL (ref 6–23)
Calcium: 9.8 mg/dL (ref 8.4–10.5)
Chloride: 102 mEq/L (ref 96–112)
Glucose, Bld: 100 mg/dL — ABNORMAL HIGH (ref 70–99)
Potassium: 5.1 mEq/L (ref 3.5–5.3)

## 2013-04-16 MED ORDER — LISINOPRIL-HYDROCHLOROTHIAZIDE 20-12.5 MG PO TABS: 1.0000 | ORAL_TABLET | Freq: Every day | ORAL | Status: DC

## 2013-04-16 NOTE — Patient Instructions (Signed)
Continue to take you BP meds DAILY!  Work on [employer] and exercise.

## 2013-04-16 NOTE — Progress Notes (Signed)
  Subjective:    Patient ID: Gabriela Torres, female    DOB: 1970/10/25, 42 y.o.   MRN: [id number]  HPI  [name] is a 42 year old female with HTN and HLD who is here for medication refills. She has been out of of Lisinopril-HCTZ for about a week currently. She has started developing headaches, dizziness with position changes, blurry vision on occasion, and swelling in legs. These are common symptoms for her when she is off her BP med and resolve upon restarting her medication. A month ago, she had been out of her med for a week or two and had severe migraines and high BP symptoms. She was taking Imitrex daily at that time. Her symptoms resolved when she restarted her meds. She knows that she needs to take her meds consistently and come see [place] more frequently so she does not run out of her medications. She denies chest pain, palpitations, SOB, or syncope.   [name] is no taking her Zetia for cholesterol but does not remember why she stopped taking it. She has not tolerated Statins in the past. She tries to watch her diet but knows there is room for improvement. She has fallen off her exercise routine.  Review of Systems As above.     Objective:   Physical Exam  Constitutional: She is oriented to person, place, and time. She appears well-developed and well-nourished. No distress.  HENT:  Head: Normocephalic and atraumatic.  Eyes: Conjunctivae are normal. Pupils are equal, round, and reactive to light. Right eye exhibits no exudate. Left eye exhibits no exudate. No scleral icterus.  Fundoscopic exam:      The right eye shows no arteriolar narrowing, no AV nicking, no exudate, no hemorrhage and no papilledema. The right eye shows red reflex.       The left eye shows no arteriolar narrowing, no AV nicking, no exudate, no hemorrhage and no papilledema. The left eye shows red reflex.  Neck: Carotid bruit is not present. No mass present.  Cardiovascular: Normal rate, regular rhythm, normal  heart sounds and normal pulses.   Pulmonary/Chest: Effort normal and breath sounds normal.  Lymphadenopathy:    She has no cervical adenopathy.  Neurological: She is alert and oriented to person, place, and time. She has normal strength. No cranial nerve deficit. She displays a negative Romberg sign.  Skin: She is not diaphoretic.  Psychiatric: She has a normal mood and affect.     BP 134/82  Pulse 74  Temp(Src) 98.8 F (37.1 C) (Oral)  Resp 16  Ht 5\' 6"  (1.676 m)  Wt 219 lb (99.338 kg)  BMI 35.36 kg/m2  SpO2 96%   Assessment & Plan:  HTN (hypertension) - Plan: Comprehensive metabolic panel, lisinopril-hydrochlorothiazide (PRINZIDE,ZESTORETIC) 20-12.5 MG per tablet  Hyperlipidemia - Plan: Lipid panel  Need for influenza vaccination - Plan: Flu Vaccine QUAD 36+ mos IM  Patient Instructions  Continue to take you BP meds DAILY!  Work on [employer] and exercise.     We will contact you with your lab results. F/U in 3 months for med check.

## 2013-04-17 DIAGNOSIS — G43909 Migraine, unspecified, not intractable, without status migrainosus: Secondary | ICD-10-CM | POA: Insufficient documentation

## 2013-04-17 NOTE — Progress Notes (Signed)
I have examined this patient along with the student and agree.  Anticipate the need to restart Zetia for lipid-lowering.

## 2013-04-20 MED ORDER — EZETIMIBE 10 MG PO TABS: 10.0000 mg | ORAL_TABLET | Freq: Every day | ORAL | Status: DC

## 2013-04-20 NOTE — Addendum Note (Signed)
Addended by: Fernande Bras on: 04/20/2013 12:44 PM   Modules accepted: Orders

## 2013-07-16 ENCOUNTER — Other Ambulatory Visit: Payer: Self-pay | Admitting: Physician Assistant

## 2013-08-17 ENCOUNTER — Ambulatory Visit: Payer: Self-pay | Admitting: Family Medicine

## 2013-08-17 VITALS — BP 118/83 | HR 61 | Temp 98.0°F | Resp 18 | Wt 216.0 lb

## 2013-08-17 DIAGNOSIS — I10 Essential (primary) hypertension: Secondary | ICD-10-CM

## 2013-08-17 DIAGNOSIS — E785 Hyperlipidemia, unspecified: Secondary | ICD-10-CM

## 2013-08-17 MED ORDER — LISINOPRIL-HYDROCHLOROTHIAZIDE 20-12.5 MG PO TABS: ORAL_TABLET | ORAL | Status: DC

## 2013-08-17 MED ORDER — NIACIN ER 500 MG PO CPCR: 500.0000 mg | ORAL_CAPSULE | Freq: Every day | ORAL | Status: DC

## 2013-08-17 NOTE — Patient Instructions (Signed)
Continue to use the lisinopril/ hctz for your BP.  It looks good today We are going to start niacin 500 mg at bedtime for your cholesterol.  If you tolerate this well we can increase to 1,000 mg in a month.  I will place a future order for a cholesterol panel.  Please come by for a cholesterol check only in 2-3 months.  Let me know if you have problems with this medication

## 2013-08-17 NOTE — Progress Notes (Signed)
[facility] [address], [place] [address] [phone]  Date:  08/17/2013   Name:  Gabriela Torres   DOB:  September 29, 1970   MRN:  [id number]  PCP:  Lamar Blinks, MD    Chief Complaint: Medication Refill   History of Present Illness:  Gabriela Torres is a 43 y.o. very pleasant female patient who presents with the following:  She is here today for a refill of her medicatoin- she needs her lisinopril and also wants to discuss cholesterol meds She also notes an itchy rash on the dorsum of her left hand for a few days to a week.  She has noted this over time- benadryl sometimes helps.  She is not too concerned about this but just wanted to mention it.  This has come and gone for a long time.  No other rash noted.    She had labs done in October- a fasting lipid panel showed total of 279, LDL of 221.  No meds at that time.   She is currently not taking any cholesterol medications.  She was taking zetia with success but was not able to continue to take it due to cost.  She has a history of muscle pains with statin meds She is concerned about would like to try something different for her cholesterol   Patient Active Problem List   Diagnosis Date Noted  . Migraine 04/17/2013  . HTN (hypertension) 05/18/2012  . Hyperlipidemia 05/18/2012  . Ventral hernia 09/03/2011    Past Medical History  Diagnosis Date  . Abdominal wall hernia   . Hypertension   . Hyperlipidemia   . Abdominal pain   . Hearing loss   . Migraines   . Anxiety     Past Surgical History  Procedure Laterality Date  . Tonsillectomy  2008  . Nasal turbinate reduction  2000  . Abdominal hysterectomy  09/2003 or 09/2004     total   . Ventral hernia repair  11/01/2011    Procedure: LAPAROSCOPIC VENTRAL HERNIA;  Surgeon: Merrie Roof, MD;  Location: [facility];  Service: General;  Laterality: N/A;  . Hernia repair      History  Substance Use Topics  . Smoking status: Former Smoker -- 0.25  packs/day for 1 years    Types: Cigarettes    Quit date: 10/24/1981  . Smokeless tobacco: Never Used     Comment: social drinker  . Alcohol Use: Yes     Comment: 3 - 4 per week    Family History  Problem Relation Age of Onset  . Cancer Mother     abdominal  . Cancer Father     colon  . Heart disease Sister     chf    Allergies  Allergen Reactions  . Statins Other (See Comments)    Left lower and rear flank pain.  . Food Nausea Only    Dill, rye  . Guaifenesin & Derivatives Nausea And Vomiting    Medication list has been reviewed and updated.  Current Outpatient Prescriptions on File Prior to Visit  Medication Sig Dispense Refill  . ALPRAZolam (XANAX) 0.25 MG tablet Take 0.25 mg by mouth 3 (three) times daily as needed. For anxiety.      Marland Kitchen escitalopram (LEXAPRO) 20 MG tablet Take 20 mg by mouth daily.      . fluticasone (FLONASE) 50 MCG/ACT nasal spray Place 4 sprays into the nose daily. 2 sprays in each nostril      .  lisinopril-hydrochlorothiazide (PRINZIDE,ZESTORETIC) 20-12.5 MG per tablet PT NEEDS APPT FOR FURTHER REFILLS  30 tablet  0   No current facility-administered medications on file prior to visit.    Review of Systems:  As per HPI- otherwise negative.   Physical Examination: Filed Vitals:   08/17/13 0851  BP: 118/83  Pulse: 61  Temp: 98 F (36.7 C)  Resp: 18   Filed Vitals:   08/17/13 0851  Weight: 216 lb (97.977 kg)   Body mass index is 34.88 kg/(m^2). Ideal Body Weight:    GEN: WDWN, NAD, Non-toxic, A & O x 3, looks well HEENT: Atraumatic, Normocephalic. Neck supple. No masses, No LAD. Ears and Nose: No external deformity. CV: RRR, No M/G/R. No JVD. No thrill. No extra heart sounds. PULM: CTA B, no wheezes, crackles, rhonchi. No retractions. No resp. distress. No accessory muscle use. EXTR: No c/c/e NEURO Normal gait.  PSYCH: Normally interactive. Conversant. Not depressed or anxious appearing.  Calm demeanor.  Left dorsal hand shows  scattered raised papules.  No other rash   Assessment and Plan: HTN (hypertension) - Plan: lisinopril-hydrochlorothiazide (PRINZIDE,ZESTORETIC) 20-12.5 MG per tablet  Dyslipidemia - Plan: niacin 500 MG CR capsule, Lipid panel  Continue her BP meds- good control.  S/p hyst so no risk of pregnancy Will try niacin for her cholesterol.  Start at 500 mg qhs, increase to 1000 in one month  See patient instructions for more details.     Signed Lamar Blinks, MD

## 2013-09-16 NOTE — Telephone Encounter (Signed)
ERROR

## 2013-11-16 ENCOUNTER — Ambulatory Visit: Payer: Self-pay | Admitting: Emergency Medicine

## 2013-11-16 VITALS — BP 138/90 | HR 74 | Temp 98.2°F | Resp 18 | Ht 65.0 in | Wt 217.0 lb

## 2013-11-16 DIAGNOSIS — T783XXA Angioneurotic edema, initial encounter: Secondary | ICD-10-CM

## 2013-11-16 NOTE — Progress Notes (Signed)
   Subjective:    Patient ID: Gabriela Torres, female    DOB: April 22, 1971, 43 y.o.   MRN: [id number]  HPI 43 yo female presents for evaluation of lip swelling.  She states it started today in the afternoon. She is currently taking lisinopril/HCTZ.  No shortness of breath.  No difficulty swallowing at present.  States this has happened in past several times, but not as severe.  She thought this was related to seasonal allergies.  PPMH:  Hypertension  SH:  Former smoker, occasional alcohol   Review of Systems  Constitutional: Negative for chills.  HENT: Positive for facial swelling. Negative for sore throat, trouble swallowing and voice change.   Respiratory: Negative for cough, shortness of breath, wheezing and stridor.        Objective:   Physical Exam Blood pressure 138/90, pulse 74, temperature 98.2 F (36.8 C), temperature source Oral, resp. rate 18, height 5\' 5"  (1.651 m), weight 217 lb (98.431 kg), SpO2 97.00%. Body mass index is 36.11 kg/(m^2). Well-developed, well nourished female who is awake, alert and oriented, in NAD. HEENT: [place]/AT, PERRL, EOMI.  Sclera and conjunctiva are clear.  Angioedema of the upper lip, larger on left than right.  No tongue involvement.  No lower lip involvement.   Neck: supple, non-tender, no lymphadenopathy, no stirdor Heart: RRR, no murmur Lungs: normal effort, CTA, no wheeze, no stridor. Abdomen: normo-active bowel sounds, supple, non-tender, no mass or organomegaly. Extremities: no cyanosis, clubbing or edema. Skin: warm and dry without rash. Psychologic: good mood and appropriate affect, normal speech and behavior.     Assessment & Plan:  Angioedema -  Probably ACE inhibitor related.  I have recommended to the patient that she go to the emergency department and that we transfer her by ambulance for observation.  She states that she will not go to hospital by ambulance and was hesitant to go to hospital at all.  I advised her that this condition  can deteriorate rapidly and she can potentially die quickly from this condition.  She states she understands this, however is not willing to go by ambulance.  Upon completion of our discussion she states that she will go to [facility] ED, despite my recommendation to go to [facility] by ambulance as it is closest.  I notified the [facility] ED staff of her condition and likely visit.  I have also advised her to no longer take lisinopril or any ACE inhibitor in the future.

## 2013-11-17 ENCOUNTER — Telehealth: Payer: Self-pay

## 2013-11-17 DIAGNOSIS — I10 Essential (primary) hypertension: Secondary | ICD-10-CM

## 2013-11-17 MED ORDER — AMLODIPINE BESYLATE 5 MG PO TABS: 5.0000 mg | ORAL_TABLET | Freq: Every day | ORAL | Status: DC

## 2013-11-17 NOTE — Telephone Encounter (Signed)
Called her- she had angioedema due to lisinopril most likely. Will d/c this medication, start norvasc at 5mg  a day.  She has a BP cuff and will monitor, give me a call in a week or so with a BP update

## 2013-11-17 NOTE — Telephone Encounter (Signed)
Spoke to patient.  Had swelling in upper hard pallet and lips. Swelling is gone today except for upper lip. States she went to ER and they advised her to follow up with Dr. Lorelei Pont for a new bp med.

## 2013-11-17 NOTE — Telephone Encounter (Signed)
Dr. Lorelei Pont   Patient went to the ER last night from a reaction to the lisinopril-hydrochlorothiazide (PRINZIDE,ZESTORETIC) 20-12.5 MG per tablet She needs something else for treatmetn.     [facility] [address] in [place]

## 2014-02-15 ENCOUNTER — Telehealth: Payer: Self-pay

## 2014-02-15 NOTE — Telephone Encounter (Signed)
[facility] - HER MEDICINE WAS SWITCHED THREE MONTHS AGO.  SAID IT WAS BUMPED UP BY 10 OR 20 MG.  IT IS NOT WORKING, HER BP IS STILL HIGH AND SHE IS NOT FEELING WELL.  [phone]

## 2014-02-16 NOTE — Telephone Encounter (Signed)
Called her back- she is now taking 10mg  of amlodipine. She had been on lisinopril/hctz but his was topped due to an angioedema event. Her BP is running 140- 150/90.  Sh has noted HA, nausea and dizziness.  She is getting headaches 2-3x for the last month.  She is not acutely worse and would prefer to come and see me on Friday- this is ok, she will seek care sooner if she is worse or if sx change

## 2014-02-16 NOTE — Telephone Encounter (Signed)
Spoke to pt: headaches twice weekly, feeling flushed, dizziness, nauseated when BP is elevated, no chest pain.  Bp: 140-150/90-92 She would like to have BP medication increased. Currently taking amlodipine 10mg 

## 2014-02-18 ENCOUNTER — Ambulatory Visit: Payer: Self-pay

## 2014-03-09 ENCOUNTER — Ambulatory Visit (INDEPENDENT_AMBULATORY_CARE_PROVIDER_SITE_OTHER): Payer: Self-pay | Admitting: Family Medicine

## 2014-03-09 ENCOUNTER — Encounter: Payer: Self-pay | Admitting: Family Medicine

## 2014-03-09 VITALS — BP 134/84 | HR 81 | Temp 98.2°F | Resp 18 | Ht 65.5 in | Wt 214.4 lb

## 2014-03-09 DIAGNOSIS — I10 Essential (primary) hypertension: Secondary | ICD-10-CM

## 2014-03-09 LAB — BASIC METABOLIC PANEL
BUN: 14 mg/dL (ref 6–23)
CALCIUM: 9.5 mg/dL (ref 8.4–10.5)
CO2: 27 meq/L (ref 19–32)
CREATININE: 0.64 mg/dL (ref 0.50–1.10)
Chloride: 100 mEq/L (ref 96–112)
Glucose, Bld: 99 mg/dL (ref 70–99)
Potassium: 3.8 mEq/L (ref 3.5–5.3)
Sodium: 136 mEq/L (ref 135–145)

## 2014-03-09 MED ORDER — HYDROCHLOROTHIAZIDE 25 MG PO TABS: ORAL_TABLET | ORAL | Status: DC

## 2014-03-09 MED ORDER — AMLODIPINE BESYLATE 10 MG PO TABS: 10.0000 mg | ORAL_TABLET | Freq: Every day | ORAL | Status: DC

## 2014-03-09 NOTE — Patient Instructions (Signed)
Please get in touch with me with a BP update in a couple of weeks.  For now take amlodipine 10mg  and HCTZ 12.5 (1/2 tablet) daily.

## 2014-03-09 NOTE — Progress Notes (Addendum)
[facility] [address] [place] [address] [phone]  Date:  03/09/2014   Name:  Gabriela Torres   DOB:  05-18-1971   MRN:  [id number]  PCP:  Lamar Blinks, MD    Chief Complaint: Hypertension   History of Present Illness:  Gabriela Torres is a 43 y.o. very pleasant female patient who presents with the following:  Here today to check her BP. She was here in May with angioedema and her lisinopril/hctz was stopped.  She was started on norvasc 5mg  a day after this incident. She then contacted me about 3 weeks ago because her BP was running 140-150/90 on norvasc 10mg . I asked her to come in and see me but she just appeared today.    She states that because her BP was too high, she got concerned and decided to increase her norvasc done on her own.  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  She has noted some headaches. She then increased her norvasc to 20 mg, and then to 30 mg.  She has noted that her BP seems to look ok but she still has some headaches.    S/p hysterectomy  Patient Active Problem List   Diagnosis Date Noted  . Migraine 04/17/2013  . HTN (hypertension) 05/18/2012  . Hyperlipidemia 05/18/2012  . Ventral hernia 09/03/2011    Past Medical History  Diagnosis Date  . Abdominal wall hernia   . Hypertension   . Hyperlipidemia   . Abdominal pain   . Hearing loss   . Migraines   . Anxiety     Past Surgical History  Procedure Laterality Date  . Tonsillectomy  2008  . Nasal turbinate reduction  2000  . Abdominal hysterectomy  09/2003 or 09/2004     total   . Ventral hernia repair  11/01/2011    Procedure: LAPAROSCOPIC VENTRAL HERNIA;  Surgeon: Merrie Roof, MD;  Location: [facility];  Service: General;  Laterality: N/A;  . Hernia repair      History  Substance Use Topics  . Smoking status: Former Smoker -- 0.25 packs/day for 1 years    Types: Cigarettes    Quit date: 10/24/1981  .  Smokeless tobacco: Never Used     Comment: social drinker  . Alcohol Use: Yes     Comment: 3 - 4 per week    Family History  Problem Relation Age of Onset  . Cancer Mother     abdominal  . Cancer Father     colon  . Heart disease Sister     chf    Allergies  Allergen Reactions  . Statins Other (See Comments)    Left lower and rear flank pain.  . Lisinopril     Angioedema   . Food Nausea Only    Dill, rye  . Guaifenesin & Derivatives Nausea And Vomiting    Medication list has been reviewed and updated.  Current Outpatient Prescriptions on File Prior to Visit  Medication Sig Dispense Refill  . ALPRAZolam (XANAX) 0.25 MG tablet Take 0.25 mg by mouth 3 (three) times daily as needed. For anxiety.      Marland Kitchen amLODipine (NORVASC) 5 MG tablet Take 1 tablet (5 mg total) by mouth daily. Increase to 10 mg if directed.  60 tablet  3  . DiphenhydrAMINE HCl (BENADRYL ALLERGY PO) Take 25 mg by mouth.      . escitalopram (  LEXAPRO) 20 MG tablet Take 20 mg by mouth daily.      . fluticasone (FLONASE) 50 MCG/ACT nasal spray Place 4 sprays into the nose daily. 2 sprays in each nostril       No current facility-administered medications on file prior to visit.    Review of Systems:  As per HPI- otherwise negative.   Physical Examination: Filed Vitals:   03/09/14 0843  BP: 134/84  Pulse: 81  Temp: 98.2 F (36.8 C)  Resp: 18   Filed Vitals:   03/09/14 0843  Height: 5' 5.5" (1.664 m)  Weight: 214 lb 6.4 oz (97.251 kg)   Body mass index is 35.12 kg/(m^2). Ideal Body Weight: Weight in (lb) to have BMI = 25: 152.2  GEN: WDWN, NAD, Non-toxic, A & O x 3, overweight, looks well HEENT: Atraumatic, Normocephalic. Neck supple. No masses, No LAD. Ears and Nose: No external deformity. CV: RRR, No M/G/R. No JVD. No thrill. No extra heart sounds. PULM: CTA B, no wheezes, crackles, rhonchi. No retractions. No resp. distress. No accessory muscle use. EXTR: No c/c/e NEURO Normal gait.   PSYCH: Normally interactive. Conversant. Not depressed or anxious appearing.  Calm demeanor.   She declined an EKG today due to cost.  Explained that she could have a conduction abnormality due to norvasc overuse, contacted poison control and there are no other significant concerns here Assessment and Plan: Essential hypertension - Plan: amLODipine (NORVASC) 10 MG tablet, hydrochlorothiazide (HYDRODIURIL) 25 MG tablet, Basic metabolic panel  Uncontrolled HTN.  Explained that she cannot take 30mg  of norvasc because this is well over the max dose.  She had an angioedema reaction when she was on lisinopril/hctz a few months ago.  Explained that her rxn was likely due to the ace- inhibitor, but it is possible she reacted to HCTZ.  She otherwise did well with HCTZ and would like to go back on it, and understands the risk of possible allergy.  If any swelling or other sx of allergy she will stop HCTZ and seek care right away.  She will continue norvasx 10 mg and add hctz 12.5, increase to 25 as needed. 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

## 2014-04-01 ENCOUNTER — Telehealth: Payer: Self-pay

## 2014-04-01 NOTE — Telephone Encounter (Signed)
Spoke to pt- these are her most recent BP's. No CP, SOB. She is going to monitor her BP for the next few weeks and call us with an update. She is aware of the symptoms to monitor for emergency. She has a more active job and her schedule has flipped.

## 2014-04-01 NOTE — Telephone Encounter (Signed)
Pt of Dr. Lorelei Pont is calling back with BP readings; 142/99, 143/ 99, taken in the morning after she gets off of work. Pt stated that she has just started working a very physical job that may be affecting it, and her psychiatric Dr. Just prescribed her 20mg  adderall 5 days a week. She would like to know if she should adjust her meds?  Ph: [phone]

## 2014-04-06 ENCOUNTER — Ambulatory Visit (INDEPENDENT_AMBULATORY_CARE_PROVIDER_SITE_OTHER): Payer: Self-pay | Admitting: Family Medicine

## 2014-04-06 VITALS — BP 126/70 | HR 78 | Temp 98.1°F | Resp 18 | Ht 66.0 in | Wt 216.0 lb

## 2014-04-06 DIAGNOSIS — M791 Myalgia, unspecified site: Secondary | ICD-10-CM

## 2014-04-06 DIAGNOSIS — IMO0001 Reserved for inherently not codable concepts without codable children: Secondary | ICD-10-CM

## 2014-04-06 DIAGNOSIS — R10A0 Flank pain, unspecified side: Secondary | ICD-10-CM

## 2014-04-06 DIAGNOSIS — R109 Unspecified abdominal pain: Secondary | ICD-10-CM

## 2014-04-06 LAB — BASIC METABOLIC PANEL
BUN: 13 mg/dL (ref 6–23)
CO2: 29 meq/L (ref 19–32)
Calcium: 9.6 mg/dL (ref 8.4–10.5)
Chloride: 95 mEq/L — ABNORMAL LOW (ref 96–112)
Creat: 0.65 mg/dL (ref 0.50–1.10)
GLUCOSE: 80 mg/dL (ref 70–99)
POTASSIUM: 3.6 meq/L (ref 3.5–5.3)
SODIUM: 136 meq/L (ref 135–145)

## 2014-04-06 LAB — POCT CBC
GRANULOCYTE PERCENT: 49.7 % (ref 37–80)
HCT, POC: 42.1 % (ref 37.7–47.9)
Hemoglobin: 13.4 g/dL (ref 12.2–16.2)
Lymph, poc: 3.1 (ref 0.6–3.4)
MCH, POC: 24.5 pg — AB (ref 27–31.2)
MCHC: 31.8 g/dL (ref 31.8–35.4)
MCV: 77 fL — AB (ref 80–97)
MID (CBC): 0.4 (ref 0–0.9)
MPV: 8.1 fL (ref 0–99.8)
PLATELET COUNT, POC: 281 10*3/uL (ref 142–424)
POC GRANULOCYTE: 3.4 (ref 2–6.9)
POC LYMPH %: 44.4 % (ref 10–50)
POC MID %: 5.9 % (ref 0–12)
RBC: 5.46 M/uL (ref 4.04–5.48)
RDW, POC: 16.1 %
WBC: 6.9 10*3/uL (ref 4.6–10.2)

## 2014-04-06 LAB — POCT URINALYSIS DIPSTICK
Bilirubin, UA: NEGATIVE
Glucose, UA: NEGATIVE
Ketones, UA: NEGATIVE
LEUKOCYTES UA: NEGATIVE
Nitrite, UA: NEGATIVE
Protein, UA: NEGATIVE
Spec Grav, UA: 1.015
UROBILINOGEN UA: 0.2
pH, UA: 7

## 2014-04-06 LAB — POCT UA - MICROSCOPIC ONLY
Bacteria, U Microscopic: NEGATIVE
CASTS, UR, LPF, POC: NEGATIVE
Crystals, Ur, HPF, POC: NEGATIVE
Epithelial cells, urine per micros: NEGATIVE
Mucus, UA: NEGATIVE
WBC, Ur, HPF, POC: NEGATIVE
YEAST UA: NEGATIVE

## 2014-04-06 LAB — CK: Total CK: 291 U/L — ABNORMAL HIGH (ref 7–177)

## 2014-04-06 MED ORDER — CYCLOBENZAPRINE HCL 10 MG PO TABS: 10.0000 mg | ORAL_TABLET | Freq: Two times a day (BID) | ORAL | Status: DC | PRN

## 2014-04-06 NOTE — Patient Instructions (Addendum)
Please take several days to rest and drink plenty of fluids.  You can use the muscle relaxer as needed.  Remember it will make you sleepy- do not drive while you are taking it!  Try to move around frequently to keep your muscles loose.  I will be in touch with your labs asap.  It your urine turns red or "tea colored" please seek care

## 2014-04-06 NOTE — Progress Notes (Addendum)
[facility] [address] [place] [address] [phone]  Date:  04/06/2014   Name:  Gabriela Torres   DOB:  1971/03/14   MRN:  [id number]  PCP:  Lamar Blinks, MD    Chief Complaint: Generalized Body Aches, Flank Pain and Dark Urine   History of Present Illness:  Gabriela Torres is a 43 y.o. very pleasant female patient who presents with the following:  She has started a new part- time job and plans to start school next week.   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  She is unloading some larger and heavy boxes.  This is a sharp increase in her activity.  She does not want to quit this job but is not sure if she can physically handle it.    She started this job on 9/21.  Yesterday she was in so much pain she could barely walk- she hurts all over. Knees are painful, back painful.  Once she gets moving she can walk ok but she is very stiff.   Her urine looked a "little darker" yesterday when she got off work.   She had really not been exercising over the last year or so.  She is not in very good shape   Patient Active Problem List   Diagnosis Date Noted  . Migraine 04/17/2013  . HTN (hypertension) 05/18/2012  . Hyperlipidemia 05/18/2012  . Ventral hernia 09/03/2011    Past Medical History  Diagnosis Date  . Abdominal wall hernia   . Hypertension   . Hyperlipidemia   . Abdominal pain   . Hearing loss   . Migraines   . Anxiety     Past Surgical History  Procedure Laterality Date  . Tonsillectomy  2008  . Nasal turbinate reduction  2000  . Abdominal hysterectomy  09/2003 or 09/2004     total   . Ventral hernia repair  11/01/2011    Procedure: LAPAROSCOPIC VENTRAL HERNIA;  Surgeon: Merrie Roof, MD;  Location: [place];  Service: General;  Laterality: N/A;  . Hernia repair      History  Substance Use Topics  . Smoking status: Former Smoker -- 0.25 packs/day for 1 years    Types: Cigarettes    Quit date: 10/24/1981  .  Smokeless tobacco: Never Used     Comment: social drinker  . Alcohol Use: Yes     Comment: 3 - 4 per week    Family History  Problem Relation Age of Onset  . Cancer Mother     abdominal  . Cancer Father     colon  . Heart disease Sister     chf    Allergies  Allergen Reactions  . Statins Other (See Comments)    Left lower and rear flank pain.  . Lisinopril     Angioedema   . Food Nausea Only    Dill, rye  . Guaifenesin & Derivatives Nausea And Vomiting    Medication list has been reviewed and updated.  Current Outpatient Prescriptions on File Prior to Visit  Medication Sig Dispense Refill  . ALPRAZolam (XANAX) 0.25 MG tablet Take 0.25 mg by mouth 3 (three) times daily as needed. For anxiety.      Marland Kitchen amLODipine (NORVASC) 10 MG tablet Take 1 tablet (10 mg total) by mouth daily.  90 tablet  3  . escitalopram (LEXAPRO) 20 MG tablet Take 20 mg by mouth daily.      Marland Kitchen  hydrochlorothiazide (HYDRODIURIL) 25 MG tablet Start with a 1/2 tablet daily and increase as directed  90 tablet  3  . DiphenhydrAMINE HCl (BENADRYL ALLERGY PO) Take 25 mg by mouth.      . fluticasone (FLONASE) 50 MCG/ACT nasal spray Place 4 sprays into the nose daily. 2 sprays in each nostril       No current facility-administered medications on file prior to visit.    Review of Systems:  As per HPI- otherwise negative.   Physical Examination: Filed Vitals:   04/06/14 0911  BP: 126/70  Pulse: 78  Temp: 98.1 F (36.7 C)  Resp: 18   Filed Vitals:   04/06/14 0911  Height: 5\' 6"  (1.676 m)  Weight: 216 lb (97.977 kg)   Body mass index is 34.88 kg/(m^2). Ideal Body Weight: Weight in (lb) to have BMI = 25: 154.6  GEN: WDWN, NAD, Non-toxic, A & O x 3, overweight HEENT: Atraumatic, Normocephalic. Neck supple. No masses, No LAD. Ears and Nose: No external deformity. CV: RRR, No M/G/R. No JVD. No thrill. No extra heart sounds. PULM: CTA B, no wheezes, crackles, rhonchi. No retractions. No resp.  distress. No accessory muscle use. ABD: S, NT, ND EXTR: No c/c/e NEURO Normal nerve function but she is moving very slowly and stiffly  She is tender over the muscles of her arms, legs, back, and shoulder.  She has several bruises over her limbs where she lost control of a box and it hit her PSYCH: Normally interactive. Conversant. Not depressed or anxious appearing.  Calm demeanor.   Results for orders placed in visit on 04/06/14  POCT UA - MICROSCOPIC ONLY      Result Value Ref Range   WBC, Ur, HPF, POC neg     RBC, urine, microscopic 0-2     Bacteria, U Microscopic neg     Mucus, UA neg     Epithelial cells, urine per micros neg     Crystals, Ur, HPF, POC neg     Casts, Ur, LPF, POC neg     Yeast, UA neg    POCT URINALYSIS DIPSTICK      Result Value Ref Range   Color, UA yellow     Clarity, UA clear     Glucose, UA neg     Bilirubin, UA neg     Ketones, UA neg     Spec Grav, UA 1.015     Blood, UA moderate     pH, UA 7.0     Protein, UA neg     Urobilinogen, UA 0.2     Nitrite, UA neg     Leukocytes, UA Negative    POCT CBC      Result Value Ref Range   WBC 6.9  4.6 - 10.2 K/uL   Lymph, poc 3.1  0.6 - 3.4   POC LYMPH PERCENT 44.4  10 - 50 %L   MID (cbc) 0.4  0 - 0.9   POC MID % 5.9  0 - 12 %M   POC Granulocyte 3.4  2 - 6.9   Granulocyte percent 49.7  37 - 80 %G   RBC 5.46  4.04 - 5.48 M/uL   Hemoglobin 13.4  12.2 - 16.2 g/dL   HCT, POC 42.1  37.7 - 47.9 %   MCV 77.0 (*) 80 - 97 fL   MCH, POC 24.5 (*) 27 - 31.2 pg   MCHC 31.8  31.8 - 35.4 g/dL   RDW, POC 16.1  Platelet Count, POC 281  142 - 424 K/uL   MPV 8.1  0 - 99.8 fL    Assessment and Plan: Muscle pain - Plan: Basic metabolic panel, CK, POCT CBC, cyclobenzaprine (FLEXERIL) 10 MG tablet  Flank pain - Plan: POCT UA - Microscopic Only, POCT urinalysis dipstick, Urine culture  Diffuse muscle pain due to overuse.  Explained that I am not sure if she will be able to tolerate this job- she agrees. She will  rest and use flexeril as needed, and I will be in touch with the rest of her labs.  She may go back to this job, but she is not sure what she will do  Signed Lamar Blinks, MD  Received the rest of her labs and gave her a call.  Her renal function and CK look good.

## 2014-04-07 LAB — URINE CULTURE

## 2014-04-09 ENCOUNTER — Encounter: Payer: Self-pay | Admitting: Family Medicine

## 2014-04-25 ENCOUNTER — Telehealth: Payer: Self-pay

## 2014-04-25 DIAGNOSIS — T783XXD Angioneurotic edema, subsequent encounter: Secondary | ICD-10-CM

## 2014-04-25 MED ORDER — EPINEPHRINE 0.3 MG/0.3ML IJ SOAJ: 0.3000 mg | Freq: Once | INTRAMUSCULAR | Status: DC

## 2014-04-25 NOTE — Telephone Encounter (Signed)
Called her back- she states that "my lip is swollen, the angioedema is back."  She noted the swelling on one side of her lower lip again this am.  She states she has no difficulty breathing and that her tongue/mouth is not swollen. We added back HCTZ about 6 weeks ago.  We had stopped lisinopril/ hctz due to angioedema earlier in the summer.  We thought her original angioedema was likely due to HCTZ so she felt comfortable going back on HCTZ for BP control.  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 Asked her to stop the hctz, and will send an epi- pen to her drug store. Asked her to please come in to be seen this evening; explained that angioedema can become quite serious.  She declines to do this as she does not think it is necessary.  However she did agree to seek care and call 911 if necessary. Otherwise she will see me in about a week to discuss her BP and try to find another solution to her BP control

## 2014-04-25 NOTE — Telephone Encounter (Signed)
Pt wants Dr Lorelei Pont to know that her blood pressure meds are causing edema of the lip again,please advise   [facility] phone [place] at [facility]

## 2015-02-22 ENCOUNTER — Other Ambulatory Visit: Payer: Self-pay | Admitting: Family Medicine

## 2015-03-02 ENCOUNTER — Telehealth: Payer: Self-pay

## 2015-03-02 DIAGNOSIS — I10 Essential (primary) hypertension: Secondary | ICD-10-CM

## 2015-03-02 MED ORDER — AMLODIPINE BESYLATE 10 MG PO TABS: 10.0000 mg | ORAL_TABLET | Freq: Every day | ORAL | Status: DC

## 2015-03-02 NOTE — Telephone Encounter (Signed)
03/19/2014 last OV. Can we refill?

## 2015-03-02 NOTE — Telephone Encounter (Signed)
Pt is needing to get her blood pressure medication refilled   Best [phone]  She wanted to let dr copland know that she is working and is trying to find time to come in and see her

## 2015-07-28 ENCOUNTER — Encounter: Payer: Self-pay | Admitting: Family Medicine

## 2015-08-02 ENCOUNTER — Encounter: Payer: Self-pay | Admitting: Family Medicine

## 2015-08-16 ENCOUNTER — Ambulatory Visit (INDEPENDENT_AMBULATORY_CARE_PROVIDER_SITE_OTHER): Payer: BLUE CROSS/BLUE SHIELD | Admitting: Family Medicine

## 2015-08-16 VITALS — BP 154/110 | HR 85 | Temp 98.0°F | Resp 17 | Ht 65.0 in | Wt 247.0 lb

## 2015-08-16 DIAGNOSIS — Z8639 Personal history of other endocrine, nutritional and metabolic disease: Secondary | ICD-10-CM | POA: Diagnosis not present

## 2015-08-16 DIAGNOSIS — Z131 Encounter for screening for diabetes mellitus: Secondary | ICD-10-CM | POA: Diagnosis not present

## 2015-08-16 DIAGNOSIS — Z130 Encounter for screening for diseases of the blood and blood-forming organs and certain disorders involving the immune mechanism: Secondary | ICD-10-CM | POA: Diagnosis not present

## 2015-08-16 DIAGNOSIS — Z23 Encounter for immunization: Secondary | ICD-10-CM

## 2015-08-16 DIAGNOSIS — I10 Essential (primary) hypertension: Secondary | ICD-10-CM

## 2015-08-16 LAB — COMPREHENSIVE METABOLIC PANEL
ALBUMIN: 3.9 g/dL (ref 3.6–5.1)
ALT: 11 U/L (ref 6–29)
AST: 11 U/L (ref 10–35)
Alkaline Phosphatase: 94 U/L (ref 33–115)
BUN: 18 mg/dL (ref 7–25)
CALCIUM: 8.8 mg/dL (ref 8.6–10.2)
CHLORIDE: 102 mmol/L (ref 98–110)
CO2: 25 mmol/L (ref 20–31)
Creat: 0.73 mg/dL (ref 0.50–1.10)
GLUCOSE: 87 mg/dL (ref 65–99)
POTASSIUM: 4.1 mmol/L (ref 3.5–5.3)
Sodium: 137 mmol/L (ref 135–146)
Total Bilirubin: 0.4 mg/dL (ref 0.2–1.2)
Total Protein: 7 g/dL (ref 6.1–8.1)

## 2015-08-16 LAB — CBC
HEMATOCRIT: 37.2 % (ref 36.0–46.0)
HEMOGLOBIN: 12.3 g/dL (ref 12.0–15.0)
MCH: 24.4 pg — AB (ref 26.0–34.0)
MCHC: 33.1 g/dL (ref 30.0–36.0)
MCV: 73.8 fL — ABNORMAL LOW (ref 78.0–100.0)
MPV: 10.1 fL (ref 8.6–12.4)
Platelets: 272 10*3/uL (ref 150–400)
RBC: 5.04 MIL/uL (ref 3.87–5.11)
RDW: 16.6 % — AB (ref 11.5–15.5)
WBC: 5.6 10*3/uL (ref 4.0–10.5)

## 2015-08-16 MED ORDER — HYDROCHLOROTHIAZIDE 25 MG PO TABS: ORAL_TABLET | ORAL | Status: DC

## 2015-08-16 NOTE — Patient Instructions (Signed)
It was great to see you today We will start you on 1/2 of an HCTZ tablet (12.5 mg) daily. If this dose brings your BP to 130/85 or less than stop there.  If not you can increase to a whole tablet. Please contact me if you have any questions about your BP  Please come and see me in about 4 months for a physical and fasting labs.   Take care!

## 2015-08-16 NOTE — Progress Notes (Signed)
[facility] [address] [place] [address] [phone]  Date:  08/16/2015   Name:  Gabriela Torres   DOB:  [birth year]   MRN:  [id number]  PCP:  Lamar Blinks, MD    Chief Complaint: Hypertension   History of Present Illness:  Gabriela Torres is a 45 y.o. very pleasant female patient who presents with the following:  Pt with history of HTN and hyperlipidemia- last seen by myself in 03/2014.  She had a rare SE of the norvasc and had gum tissue overgrowth so she had to stop taking the norvasc.  She has been off norvasc for 3-4 months She does check her BP- she is getting readings around 150/104 She is s/p hysterectomy She has had some headaches, and is taking advil.   She has a history of angioedema with lisinopril.   She sees Dr. Toy Care who writes her lexapro, xanax and adderall  BP Readings from Last 3 Encounters:  08/16/15 154/110  04/06/14 126/70  03/09/14 134/84     Patient Active Problem List   Diagnosis Date Noted  . Migraine 04/17/2013  . HTN (hypertension) 05/18/2012  . Hyperlipidemia 05/18/2012  . Ventral hernia 09/03/2011    Past Medical History  Diagnosis Date  . Abdominal wall hernia   . Hypertension   . Hyperlipidemia   . Abdominal pain   . Hearing loss   . Migraines   . Anxiety     Past Surgical History  Procedure Laterality Date  . Tonsillectomy  2008  . Nasal turbinate reduction  2000  . Abdominal hysterectomy  09/2003 or 09/2004     total   . Ventral hernia repair  11/01/2011    Procedure: LAPAROSCOPIC VENTRAL HERNIA;  Surgeon: Merrie Roof, MD;  Location: [place];  Service: General;  Laterality: N/A;  . Hernia repair      Social History  Substance Use Topics  . Smoking status: Former Smoker -- 0.25 packs/day for 1 years    Types: Cigarettes    Quit date: 10/24/1981  . Smokeless tobacco: Never Used     Comment: social drinker  . Alcohol Use: Yes     Comment: 3 - 4 per week    Family History  Problem  Relation Age of Onset  . Cancer Mother     abdominal  . Cancer Father     colon  . Heart disease Sister     chf    Allergies  Allergen Reactions  . Statins Other (See Comments)    Left lower and rear flank pain.  . Lisinopril     Angioedema   . Food Nausea Only    Dill, rye  . Guaifenesin & Derivatives Nausea And Vomiting    Medication list has been reviewed and updated.  Current Outpatient Prescriptions on File Prior to Visit  Medication Sig Dispense Refill  . ALPRAZolam (XANAX) 0.25 MG tablet Take 0.25 mg by mouth 3 (three) times daily as needed. For anxiety.    Marland Kitchen amphetamine-dextroamphetamine (ADDERALL) 20 MG tablet Take 20 mg by mouth daily.    Marland Kitchen EPINEPHrine 0.3 mg/0.3 mL IJ SOAJ injection Inject 0.3 mLs (0.3 mg total) into the muscle once. 1 Device 2  . escitalopram (LEXAPRO) 20 MG tablet Take 20 mg by mouth daily.    Marland Kitchen amLODipine (NORVASC) 10 MG tablet Take 1 tablet (10 mg total) by mouth daily. (Patient not taking: Reported on 08/16/2015) 90 tablet 1  . cyclobenzaprine (FLEXERIL) 10 MG  tablet Take 1 tablet (10 mg total) by mouth 2 (two) times daily as needed for muscle spasms. (Patient not taking: Reported on 08/16/2015) 30 tablet 0  . DiphenhydrAMINE HCl (BENADRYL ALLERGY PO) Take 25 mg by mouth. Reported on 08/16/2015    . fluticasone (FLONASE) 50 MCG/ACT nasal spray Place 4 sprays into the nose daily. Reported on 08/16/2015     No current facility-administered medications on file prior to visit.    Review of Systems:  As per HPI- otherwise negative. She is not fasting today- she did eat some chicken and ginger ale   Physical Examination: Filed Vitals:   08/16/15 0817  BP: 154/110  Pulse: 85  Temp: 98 F (36.7 C)  Resp: 17   Filed Vitals:   08/16/15 0817  Height: 5\' 5"  (1.651 m)  Weight: 247 lb (112.038 kg)   Body mass index is 41.1 kg/(m^2). Ideal Body Weight: Weight in (lb) to have BMI = 25: 149.9  GEN: WDWN, NAD, Non-toxic, A & O x 3, overweight, looks  well today HEENT: Atraumatic, Normocephalic. Neck supple. No masses, No LAD.  Bilateral TM wnl, oropharynx normal.  PEERL,EOMI.   Ears and Nose: No external deformity. CV: RRR, No M/G/R. No JVD. No thrill. No extra heart sounds. PULM: CTA B, no wheezes, crackles, rhonchi. No retractions. No resp. distress. No accessory muscle use. EXTR: No c/c/e NEURO Normal gait.  PSYCH: Normally interactive. Conversant. Not depressed or anxious appearing.  Calm demeanor.    Assessment and Plan: Essential hypertension - Plan: Comprehensive metabolic panel, hydrochlorothiazide (HYDRODIURIL) 25 MG tablet  Screening for diabetes mellitus - Plan: Comprehensive metabolic panel, Hemoglobin A1c  History of hyperlipidemia  Screening for deficiency anemia - Plan: CBC  Immunization due - Plan: Flu Vaccine QUAD 36+ mos IM  She has a history of hyperlipidimia and interolance to statins and niacin- decided to defer testing her cholesterol until we can do a complete panel.  She will come and see me in 3-4 months Start on hctz for her HTN- she will try 12.5 mg and increase to 25 if needed Will plan further follow- up pending labs.   Signed Lamar Blinks, MD

## 2015-08-17 LAB — HEMOGLOBIN A1C
Hgb A1c MFr Bld: 6.1 % — ABNORMAL HIGH (ref <5.7)
MEAN PLASMA GLUCOSE: 128 mg/dL — AB (ref <117)

## 2015-08-19 ENCOUNTER — Encounter: Payer: Self-pay | Admitting: Family Medicine

## 2015-08-19 DIAGNOSIS — R7303 Prediabetes: Secondary | ICD-10-CM

## 2015-08-19 DIAGNOSIS — E663 Overweight: Secondary | ICD-10-CM | POA: Insufficient documentation

## 2015-12-18 ENCOUNTER — Ambulatory Visit (INDEPENDENT_AMBULATORY_CARE_PROVIDER_SITE_OTHER): Payer: BLUE CROSS/BLUE SHIELD | Admitting: Family Medicine

## 2015-12-18 ENCOUNTER — Encounter: Payer: Self-pay | Admitting: Family Medicine

## 2015-12-18 VITALS — BP 138/95 | HR 77 | Temp 98.1°F | Ht 65.0 in | Wt 249.6 lb

## 2015-12-18 DIAGNOSIS — E119 Type 2 diabetes mellitus without complications: Secondary | ICD-10-CM

## 2015-12-18 DIAGNOSIS — J302 Other seasonal allergic rhinitis: Secondary | ICD-10-CM

## 2015-12-18 DIAGNOSIS — Z13220 Encounter for screening for lipoid disorders: Secondary | ICD-10-CM

## 2015-12-18 DIAGNOSIS — I10 Essential (primary) hypertension: Secondary | ICD-10-CM

## 2015-12-18 DIAGNOSIS — Z5181 Encounter for therapeutic drug level monitoring: Secondary | ICD-10-CM

## 2015-12-18 DIAGNOSIS — R208 Other disturbances of skin sensation: Secondary | ICD-10-CM

## 2015-12-18 DIAGNOSIS — Z23 Encounter for immunization: Secondary | ICD-10-CM

## 2015-12-18 DIAGNOSIS — E876 Hypokalemia: Secondary | ICD-10-CM

## 2015-12-18 DIAGNOSIS — R7303 Prediabetes: Secondary | ICD-10-CM

## 2015-12-18 DIAGNOSIS — R200 Anesthesia of skin: Secondary | ICD-10-CM

## 2015-12-18 DIAGNOSIS — Z0184 Encounter for antibody response examination: Secondary | ICD-10-CM

## 2015-12-18 LAB — HEPATITIS B SURFACE ANTIBODY, QUANTITATIVE: HEPATITIS B-POST: 198 m[IU]/mL

## 2015-12-18 LAB — LIPID PANEL
CHOLESTEROL: 232 mg/dL — AB (ref 0–200)
HDL: 36.3 mg/dL — ABNORMAL LOW (ref >39.00)
LDL Cholesterol: 180 mg/dL — ABNORMAL HIGH (ref 0–99)
NonHDL: 195.4
TRIGLYCERIDES: 78 mg/dL (ref 0.0–149.0)
Total CHOL/HDL Ratio: 6
VLDL: 15.6 mg/dL (ref 0.0–40.0)

## 2015-12-18 LAB — CBC
HCT: 36.8 % (ref 36.0–46.0)
HEMOGLOBIN: 12.1 g/dL (ref 12.0–15.0)
MCHC: 32.8 g/dL (ref 30.0–36.0)
MCV: 74.3 fl — AB (ref 78.0–100.0)
PLATELETS: 262 10*3/uL (ref 150.0–400.0)
RBC: 4.95 Mil/uL (ref 3.87–5.11)
RDW: 15.6 % — ABNORMAL HIGH (ref 11.5–15.5)
WBC: 7 10*3/uL (ref 4.0–10.5)

## 2015-12-18 LAB — COMPREHENSIVE METABOLIC PANEL
ALBUMIN: 4 g/dL (ref 3.5–5.2)
ALT: 14 U/L (ref 0–35)
AST: 14 U/L (ref 0–37)
Alkaline Phosphatase: 75 U/L (ref 39–117)
BILIRUBIN TOTAL: 0.5 mg/dL (ref 0.2–1.2)
BUN: 19 mg/dL (ref 6–23)
CALCIUM: 9.4 mg/dL (ref 8.4–10.5)
CO2: 34 mEq/L — ABNORMAL HIGH (ref 19–32)
CREATININE: 0.62 mg/dL (ref 0.40–1.20)
Chloride: 100 mEq/L (ref 96–112)
GFR: 133.11 mL/min (ref >60.00)
Glucose, Bld: 97 mg/dL (ref 70–99)
Potassium: 3 mEq/L — ABNORMAL LOW (ref 3.5–5.1)
Sodium: 142 mEq/L (ref 135–145)
Total Protein: 7.4 g/dL (ref 6.0–8.3)

## 2015-12-18 LAB — HEMOGLOBIN A1C: Hgb A1c MFr Bld: 6.7 % — ABNORMAL HIGH (ref 4.6–6.5)

## 2015-12-18 MED ORDER — HYDROCHLOROTHIAZIDE 25 MG PO TABS: ORAL_TABLET | ORAL | Status: DC

## 2015-12-18 NOTE — Progress Notes (Signed)
Pre visit review using our clinic review tool, if applicable. No additional management support is needed unless otherwise documented below in the visit note. d

## 2015-12-18 NOTE — Progress Notes (Addendum)
[place] [facility] [address], [place], [place] [address] [phone]  Date:  12/18/2015   Name:  Gabriela Torres   DOB:  1971/05/06   MRN:  [id number]  PCP:  Lamar Blinks, MD    Chief Complaint: Follow-up   History of Present Illness:  Gabriela Torres is a 45 y.o. very pleasant female patient who presents with the following:  Pt of Dr. 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    She did not take her HCTZ yet today because she was not sure if ok to take it fasting.  She has actually started taking 2 of the pills for 50mg  total. Admits she has not really been checking her BP at home/ work but can start to do so  She does plan to attend a jazz festival in [place] next weekend and is looking forward to this.   Her aunt - who is very close to her- is ill but she is very private and does not want to share much. However Mikaylah has figured out that she has cancer; this is stressful for her  Last labs done in February of this year.   She does have pre-diabetes  She did do hepatitis B shots in the past- she thinks she had all 3.  Only had 1 hep A and would like to do the 2nd shot. Had her first hep A years ago BP Readings from Last 3 Encounters:  12/18/15 152/85  08/16/15 154/110  04/06/14 126/70     Patient Active Problem List   Diagnosis Date Noted  . Pre-diabetes 08/19/2015  . Overweight 08/19/2015  . Migraine 04/17/2013  . HTN (hypertension) 05/18/2012  . Hyperlipidemia 05/18/2012  . Ventral hernia 09/03/2011    Past Medical History  Diagnosis Date  . Abdominal wall hernia   . Hypertension   . Hyperlipidemia   . Abdominal pain   . Hearing loss   . Migraines   . Anxiety     Past Surgical History  Procedure Laterality Date  . Tonsillectomy  2008  . Nasal turbinate reduction  2000  . Abdominal  hysterectomy  09/2003 or 09/2004     total   . Ventral hernia repair  11/01/2011    Procedure: LAPAROSCOPIC VENTRAL HERNIA;  Surgeon: Merrie Roof, MD;  Location: [place];  Service: General;  Laterality: N/A;  . Hernia repair      Social History  Substance Use Topics  . Smoking status: Former Smoker -- 0.25 packs/day for 1 years    Types: Cigarettes    Quit date: 10/24/1981  . Smokeless tobacco: Never Used     Comment: social drinker  . Alcohol Use: Yes     Comment: 3 - 4 per week    Family History  Problem Relation Age of Onset  . Cancer Mother     abdominal  . Cancer Father     colon  . Heart disease Sister     chf    Allergies  Allergen Reactions  . Statins Other (See Comments)    Left lower and rear flank pain.  . Lisinopril     Angioedema   . Norvasc [Amlodipine] Other (See Comments)    Gum hyperplasia  . Food Nausea Only    Dill, rye  . Guaifenesin & Derivatives Nausea  And Vomiting    Medication list has been reviewed and updated.  Current Outpatient Prescriptions on File Prior to Visit  Medication Sig Dispense Refill  . amphetamine-dextroamphetamine (ADDERALL) 20 MG tablet Take 20 mg by mouth daily.    . DiphenhydrAMINE HCl (BENADRYL ALLERGY PO) Take 25 mg by mouth. Reported on 08/16/2015    . EPINEPHrine 0.3 mg/0.3 mL IJ SOAJ injection Inject 0.3 mLs (0.3 mg total) into the muscle once. 1 Device 2  . escitalopram (LEXAPRO) 20 MG tablet Take 20 mg by mouth daily.    . fluticasone (FLONASE) 50 MCG/ACT nasal spray Place 4 sprays into the nose daily. Reported on 08/16/2015    . hydrochlorothiazide (HYDRODIURIL) 25 MG tablet Start with a 1/2 tablet daily and increase to a whole if needed (Patient taking differently: Take 2 tablets daily) 90 tablet 3   No current facility-administered medications on file prior to visit.    Review of Systems:  As per HPI- otherwise negative.   Physical Examination: Filed Vitals:   12/18/15 0901  BP: 152/85  Pulse: 77   Temp: 98.1 F (36.7 C)   Filed Vitals:   12/18/15 0901  Height: 5\' 5"  (1.651 m)  Weight: 249 lb 9.6 oz (113.218 kg)   Body mass index is 41.54 kg/(m^2). Ideal Body Weight: Weight in (lb) to have BMI = 25: 149.9  GEN: WDWN, NAD, Non-toxic, A & O x 3, obese, looks well HEENT: Atraumatic, Normocephalic. Neck supple. No masses, No LAD.  Bilateral TM wnl, oropharynx normal.  PEERL,EOMI.   Acanthosis nigricans is present  Ears and Nose: No external deformity. CV: RRR, No M/G/R. No JVD. No thrill. No extra heart sounds. PULM: CTA B, no wheezes, crackles, rhonchi. No retractions. No resp. distress. No accessory muscle use. ABD: S, NT, ND EXTR: No c/c/e NEURO Normal gait.  PSYCH: Normally interactive. Conversant. Not depressed or anxious appearing.  Calm demeanor.    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 This is getting bad enough that she would like to address it- will refer to hand surgery  She also will start checking her BP at work and will let me know if not controlled   Get a bottle of OTC loratadine (generic claritin) for your allergies.   It is ok to take 50 mg of your hydrochlorothiazide for your HTN- I changed your rx to 2 pills a day  We will check your electrolytes to make sure you have not dropped your sodium or potassium with this dose We will do a hepatitis B titer today to see if  you are immune and also will give your 2nd hep A shot today I'll be in touch with your labs asap Great to see you today!    Signed Lamar Blinks, MD  6/13- Received her labs and gave her a call- her K is low, likely due to HCTZ.  No answer and VM not set up, will try back later  Results for orders  placed or performed in visit on 12/18/15  CBC  Result Value Ref Range   WBC 7.0 4.0 - 10.5 K/uL   RBC 4.95 3.87 - 5.11 Mil/uL   Platelets 262.0 150.0 - 400.0 K/uL   Hemoglobin 12.1 12.0 - 15.0 g/dL   HCT 36.8 36.0 - 46.0 %   MCV 74.3 (L) 78.0 - 100.0 fl   MCHC 32.8 30.0 - 36.0 g/dL   RDW 15.6 (H) 11.5 - 15.5 %  Comprehensive metabolic panel  Result Value Ref Range   Sodium 142 135 - 145 mEq/L   Potassium 3.0 (L) 3.5 - 5.1 mEq/L   Chloride 100 96 - 112 mEq/L   CO2 34 (H) 19 - 32 mEq/L   Glucose, Bld 97 70 - 99 mg/dL   BUN 19 6 - 23 mg/dL   Creatinine, Ser 0.62 0.40 - 1.20 mg/dL   Total Bilirubin 0.5 0.2 - 1.2 mg/dL   Alkaline Phosphatase 75 39 - 117 U/L   AST 14 0 - 37 U/L   ALT 14 0 - 35 U/L   Total Protein 7.4 6.0 - 8.3 g/dL   Albumin 4.0 3.5 - 5.2 g/dL   Calcium 9.4 8.4 - 10.5 mg/dL   GFR 133.11 >60.00 mL/min  Lipid panel  Result Value Ref Range   Cholesterol 232 (H) 0 - 200 mg/dL   Triglycerides 78.0 0.0 - 149.0 mg/dL   HDL 36.30 (L) >39.00 mg/dL   VLDL 15.6 0.0 - 40.0 mg/dL   LDL Cholesterol 180 (H) 0 - 99 mg/dL   Total CHOL/HDL Ratio 6    NonHDL 195.40   Hemoglobin A1c  Result Value Ref Range   Hgb A1c MFr Bld 6.7 (H) 4.6 - 6.5 %  Hepatitis B surface antibody  Result Value Ref Range   Hepatitis B-Post 198.0 mIU/mL   Called again 6/13 and reached her.  She does now have DM- we will start on metformin and she will work on weight loss.  Encouraged that 20- 30 lbs of weight loss will make a huge difference for her and she plans to try Will see her back in 3 months Asked her to look at the ADA website for more info Start also kdur 20 meq daily, she will come in next  week for a lab visit to recheck her k level For now will not start cholesterol med as she is intolerant to statins, also intolerant to ACE-I

## 2015-12-18 NOTE — Patient Instructions (Addendum)
Get a bottle of OTC loratadine (generic claritin) for your allergies.   It is ok to take 50 mg of your hydrochlorothiazide for your HTN- I changed your rx to 2 pills a day  We will check your electrolytes to make sure you have not dropped your sodium or potassium with this dose We will do a hepatitis B titer today to see if you are immune and also will give your 2nd hep A shot today I'll be in touch with your labs asap Great to see you today!

## 2015-12-19 ENCOUNTER — Encounter: Payer: Self-pay | Admitting: Family Medicine

## 2015-12-19 DIAGNOSIS — E119 Type 2 diabetes mellitus without complications: Secondary | ICD-10-CM | POA: Insufficient documentation

## 2015-12-19 MED ORDER — POTASSIUM CHLORIDE CRYS ER 20 MEQ PO TBCR: 20.0000 meq | EXTENDED_RELEASE_TABLET | Freq: Every day | ORAL | Status: DC

## 2015-12-19 MED ORDER — METFORMIN HCL 500 MG PO TABS: ORAL_TABLET | ORAL | Status: DC

## 2015-12-19 NOTE — Addendum Note (Signed)
Addended by: Lamar Blinks C on: 12/19/2015 07:33 PM   Modules accepted: Orders

## 2015-12-22 ENCOUNTER — Telehealth: Payer: Self-pay | Admitting: Family Medicine

## 2015-12-22 NOTE — Telephone Encounter (Signed)
Called pt back to let her know that I would need to evaluate her prior to rx abx. No answer, VM not set up

## 2015-12-22 NOTE — Telephone Encounter (Signed)
Pt called in because she says that she is experiencing a sinus infection. Pt says that she has mucus. Pt would like to know if PCP could call in a antibiotic and also a diflucan    Pharmacy: [facility] [place]

## 2015-12-23 NOTE — Telephone Encounter (Signed)
Called- no answer, No VM

## 2015-12-23 NOTE — Telephone Encounter (Signed)
Called- no answer. No VM

## 2015-12-25 ENCOUNTER — Telehealth: Payer: Self-pay | Admitting: Family Medicine

## 2015-12-25 NOTE — Telephone Encounter (Signed)
Called again (pt called [place] and left another message).  Still no answer and no VM

## 2015-12-25 NOTE — Telephone Encounter (Signed)
Pt called in because she is experiencing some congestion and a sore throat. Pt would like to know if PCP could call her something to the pharmacy? She says if antibiotic is sent in  please call in Diflucin also.   Ph[facility]: [facility] [address]

## 2015-12-25 NOTE — Telephone Encounter (Signed)
Called again, no answer. VM full If this pt calls [place] again please let her know that her VM is full and I have been trying to reach her for 4 days.  Could she please set up my chart or come into the office to discuss this concern

## 2015-12-25 NOTE — Telephone Encounter (Signed)
Called her but no answer, no VM

## 2015-12-26 NOTE — Telephone Encounter (Addendum)
Called her and was able to reach her- she will see me tomorrow or the next day to check on her illness

## 2015-12-27 ENCOUNTER — Encounter: Payer: Self-pay | Admitting: Family Medicine

## 2015-12-27 ENCOUNTER — Ambulatory Visit (INDEPENDENT_AMBULATORY_CARE_PROVIDER_SITE_OTHER): Payer: BLUE CROSS/BLUE SHIELD | Admitting: Family Medicine

## 2015-12-27 VITALS — BP 132/82 | HR 84 | Temp 98.4°F | Ht 65.0 in | Wt 244.2 lb

## 2015-12-27 DIAGNOSIS — J9801 Acute bronchospasm: Secondary | ICD-10-CM

## 2015-12-27 DIAGNOSIS — R05 Cough: Secondary | ICD-10-CM

## 2015-12-27 DIAGNOSIS — R059 Cough, unspecified: Secondary | ICD-10-CM

## 2015-12-27 DIAGNOSIS — J209 Acute bronchitis, unspecified: Secondary | ICD-10-CM | POA: Diagnosis not present

## 2015-12-27 MED ORDER — ALBUTEROL SULFATE HFA 108 (90 BASE) MCG/ACT IN AERS: 2.0000 | INHALATION_SPRAY | Freq: Four times a day (QID) | RESPIRATORY_TRACT | Status: DC | PRN

## 2015-12-27 MED ORDER — HYDROCODONE-HOMATROPINE 5-1.5 MG/5ML PO SYRP: 5.0000 mL | ORAL_SOLUTION | Freq: Three times a day (TID) | ORAL | Status: DC | PRN

## 2015-12-27 NOTE — Patient Instructions (Signed)
Finish the azithromycin antibiotic- I think this is a good choice for your current illness Use the albuterol inhaler as needed for chest tightness and wheezing Use the cough syrup as needed; remember this will make you sleepy. Do not take it when you need to drive and do not combine it with xanax or alcohol  Take care and let me know if you are not improving over the next few days

## 2015-12-27 NOTE — Progress Notes (Signed)
Pre visit review using our clinic review tool, if applicable. No additional management support is needed unless otherwise documented below in the visit note. 

## 2015-12-27 NOTE — Progress Notes (Signed)
[place] at [facility] [address] [place], [place] [address] [phone]  Date:  12/27/2015   Name:  Gabriela Torres   DOB:  1970-09-06   MRN:  [id number]  PCP:  Lamar Blinks, MD    Chief Complaint: Cough   History of Present Illness:  Gabriela Torres is a 45 y.o. very pleasant female patient who presents with the following:  History of DM, overweight Lab Results  Component Value Date   HGBA1C 6.7* 12/18/2015    Just dx with DM at her visit about 10 days ago, started on metformin.  She first got sick about 8 days ago, she has noted a productive cough, not sleeping well due to cough, no fever noted, her ears feel clogged ("like I'm under water)", sore throat, stuffy nose, a lot of mucus from her chest and her nose.   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  She started taking it yesterday.  She took a dose yesterday and today and is feeling a bit better now.    She uses alparazolam just as needed - is ok to not take it while on cough syrup S/p hysterectomy  Patient Active Problem List   Diagnosis Date Noted  . Diabetes mellitus type 2, controlled, without complications ([place]) [id number]  . Pre-diabetes 08/19/2015  . Overweight 08/19/2015  . Migraine 04/17/2013  . HTN (hypertension) 05/18/2012  . Hyperlipidemia 05/18/2012  . Ventral hernia 09/03/2011    Past Medical History  Diagnosis Date  . Abdominal wall hernia   . Hypertension   . Hyperlipidemia   . Abdominal pain   . Hearing loss   . Migraines   . Anxiety     Past Surgical History  Procedure Laterality Date  . Tonsillectomy  2008  . Nasal turbinate reduction  2000  . Abdominal hysterectomy  09/2003 or 09/2004     total   . Ventral hernia repair  11/01/2011    Procedure:  LAPAROSCOPIC VENTRAL HERNIA;  Surgeon: Merrie Roof, MD;  Location: [facility];  Service: General;  Laterality: N/A;  . Hernia repair      Social History  Substance Use Topics  . Smoking status: Former Smoker -- 0.25 packs/day for 1 years    Types: Cigarettes    Quit date: 10/24/1981  . Smokeless tobacco: Never Used     Comment: social drinker  . Alcohol Use: Yes     Comment: 3 - 4 per week    Family History  Problem Relation Age of Onset  . Cancer Mother     abdominal  . Cancer Father     colon  . Heart disease Sister     chf    Allergies  Allergen Reactions  . Statins Other (See Comments)    Left lower and rear flank pain.  . Lisinopril     Angioedema   . Norvasc [Amlodipine] Other (See Comments)    Gum hyperplasia  . Food Nausea Only    Dill, rye  . Guaifenesin & Derivatives Nausea And Vomiting    Medication list has been reviewed and updated.  Current Outpatient Prescriptions on File Prior to Visit  Medication Sig Dispense Refill  . ALPRAZolam (XANAX) 1 MG tablet Take 1 mg by  mouth 3 (three) times daily as needed for anxiety.    Marland Kitchen amphetamine-dextroamphetamine (ADDERALL) 20 MG tablet Take 20 mg by mouth daily.    . DiphenhydrAMINE HCl (BENADRYL ALLERGY PO) Take 25 mg by mouth. Reported on 08/16/2015    . EPINEPHrine 0.3 mg/0.3 mL IJ SOAJ injection Inject 0.3 mLs (0.3 mg total) into the muscle once. 1 Device 2  . escitalopram (LEXAPRO) 20 MG tablet Take 20 mg by mouth daily.    . fluticasone (FLONASE) 50 MCG/ACT nasal spray Place 4 sprays into the nose daily. Reported on 08/16/2015    . hydrochlorothiazide (HYDRODIURIL) 25 MG tablet Take 2 tablets daily 180 tablet 3  . metFORMIN (GLUCOPHAGE) 500 MG tablet Start with one pill daily and increase to 2 pills daily (together or divided) after 2-3 weeks 180 tablet 3  . potassium chloride SA (K-DUR,KLOR-CON) 20 MEQ tablet Take 1 tablet (20 mEq total) by mouth daily. 30 tablet 3   No current facility-administered medications  on file prior to visit.    Review of Systems:  As per HPI- otherwise negative.   Physical Examination: Filed Vitals:   12/27/15 1550  BP: 132/82  Pulse: 84  Temp: 98.4 F (36.9 C)   Filed Vitals:   12/27/15 1550  Height: 5\' 5"  (1.651 m)  Weight: 244 lb 3.2 oz (110.768 kg)   Body mass index is 40.64 kg/(m^2). Ideal Body Weight: Weight in (lb) to have BMI = 25: 149.9  GEN: WDWN, NAD, Non-toxic, A & O x 3, obese HEENT: Atraumatic, Normocephalic. Neck supple. No masses, No LAD.  Bilateral TM wnl, oropharynx normal.  PEERL,EOMI.   Ears and Nose: No external deformity. CV: RRR, No M/G/R. No JVD. No thrill. No extra heart sounds. PULM: CTA B, no wheezes, crackles, rhonchi. No retractions. No resp. distress. No accessory muscle use. EXTR: No c/c/e NEURO Normal gait.  PSYCH: Normally interactive. Conversant. Not depressed or anxious appearing.  Calm demeanor.  Coughing in room.    Assessment and Plan: Acute bronchitis, unspecified organism  Bronchospasm - Plan: albuterol (PROVENTIL HFA;VENTOLIN HFA) 108 (90 Base) MCG/ACT inhaler  Cough - Plan: HYDROcodone-homatropine (HYCODAN) 5-1.5 MG/5ML syrup  Here today with illness- bronchitis.  Started on zpack yesterday which is appropriate. Also gave her an albuterol inhaler nad hycodan to use as needed   Finish the azithromycin antibiotic- I think this is a good choice for your current illness Use the albuterol inhaler as needed for chest tightness and wheezing Use the cough syrup as needed; remember this will make you sleepy. Do not take it when you need to drive and do not combine it with xanax or alcohol  Take care and let me know if you are not improving over the next few days   Signed Lamar Blinks, MD

## 2016-01-01 ENCOUNTER — Encounter: Payer: Self-pay | Admitting: Family Medicine

## 2016-01-01 MED ORDER — FLUCONAZOLE 150 MG PO TABS: 150.0000 mg | ORAL_TABLET | Freq: Once | ORAL | Status: DC

## 2016-01-04 ENCOUNTER — Encounter: Payer: Self-pay | Admitting: Family Medicine

## 2016-01-04 ENCOUNTER — Other Ambulatory Visit: Payer: Self-pay | Admitting: Emergency Medicine

## 2016-01-04 ENCOUNTER — Ambulatory Visit (HOSPITAL_BASED_OUTPATIENT_CLINIC_OR_DEPARTMENT_OTHER)
Admission: RE | Admit: 2016-01-04 | Discharge: 2016-01-04 | Disposition: A | Discharge status: Home / Self Care | Payer: BLUE CROSS/BLUE SHIELD | Source: Ambulatory Visit | Attending: Internal Medicine | Admitting: Internal Medicine

## 2016-01-04 DIAGNOSIS — R0789 Other chest pain: Secondary | ICD-10-CM

## 2016-01-04 DIAGNOSIS — R059 Cough, unspecified: Secondary | ICD-10-CM

## 2016-01-04 DIAGNOSIS — R05 Cough: Secondary | ICD-10-CM | POA: Insufficient documentation

## 2016-01-04 MED ORDER — TRAMADOL HCL 50 MG PO TABS: 50.0000 mg | ORAL_TABLET | Freq: Three times a day (TID) | ORAL | Status: DC | PRN

## 2016-01-04 NOTE — Telephone Encounter (Signed)
Tried to contact pt to get more details on her symptoms. Left voicemail for her to call back.

## 2016-01-04 NOTE — Telephone Encounter (Signed)
Have her do a CXR for [place] and then she can come up and pu Tramadol 50 mg po tid prn disp #30. If her symptoms worsen over weekend she will have to be seen  ----- Message -----   From: [name], CMA   Sent: 01/04/2016 11:43 AM    To: Mosie Lukes, MD  Subject: RE: Non-Urgent Medical Question           Spoke to pt and she states that the pain is underneath her ribs on the bottom left side. Denies any back or chest pain. SOB is worse when walking. Pt was given an albuterol inhaler which she states helps until it wears off. Rib pain is constant just much worse when coughing.     Also having sore throat on one side and headache from all the coughing.     ----- Message -----   From: Mosie Lukes, MD   Sent: 01/04/2016 10:36 AM    To: Rudene Anda, RN, [name], CMA  Subject: FW: Non-Urgent Medical Question           So I am willing to write a pain med but need to know a little more, is the pain in the abdominal wall, the back (upper or lower) or in the chest wall. Any worsening SOB and is the pain only with coughing. May have to look to rule out something more serious if pain is severe and unremitting. Please check with patient. Dr B  ----- Message -----   From: [name], CMA   Sent: 01/04/2016  9:58 AM    To: Mosie Lukes, MD  Subject: FW: Non-Urgent Medical Question           Since you are covering for Dr. Lorelei Pont just wondering what you can do for this patient. Thanks.  ----- Message -----   From: Cristal Ford Doswell   Sent: 01/04/2016  9:35 AM    To: [facility] Clinical Pool  Subject: Non-Urgent Medical Question             Dr. Edilia Bo  I am having severe pain in my side as a result of all of the constant coughing from the last week. Can you possibly call in something for pain? I've been taking Advil but it doesn't help. I have a very difficult time driving to and from work, as well as sitting  at work without my left side hurting to the point where I have to stand to alleviate the pain. Please Help.

## 2016-01-04 NOTE — Telephone Encounter (Signed)
Called pt back and informed her that she will need  to come in today to have a CXR. Once pt finishes with CXR she will come up and pick her rx for Tramadol. Pt verbalized understanding.

## 2016-01-10 ENCOUNTER — Encounter: Payer: Self-pay | Admitting: Family Medicine

## 2016-01-10 NOTE — Telephone Encounter (Signed)
Gabriela Torres-- please advise in Dr Copland's absence?

## 2016-01-10 NOTE — Telephone Encounter (Signed)
Since Dr. Lorelei Pont is out of office and I have not evaluated her, she would need to come back in for reassessment since she has continued symptoms despite ABX treatment and negative CXR.

## 2016-01-11 ENCOUNTER — Ambulatory Visit (INDEPENDENT_AMBULATORY_CARE_PROVIDER_SITE_OTHER): Payer: BLUE CROSS/BLUE SHIELD | Admitting: Family Medicine

## 2016-01-11 ENCOUNTER — Encounter: Payer: Self-pay | Admitting: Family Medicine

## 2016-01-11 VITALS — BP 136/90 | HR 76 | Temp 98.6°F | Ht 65.0 in | Wt 243.2 lb

## 2016-01-11 DIAGNOSIS — R059 Cough, unspecified: Secondary | ICD-10-CM

## 2016-01-11 DIAGNOSIS — R0989 Other specified symptoms and signs involving the circulatory and respiratory systems: Secondary | ICD-10-CM | POA: Diagnosis not present

## 2016-01-11 DIAGNOSIS — J9801 Acute bronchospasm: Secondary | ICD-10-CM | POA: Diagnosis not present

## 2016-01-11 DIAGNOSIS — R05 Cough: Secondary | ICD-10-CM | POA: Diagnosis not present

## 2016-01-11 DIAGNOSIS — E876 Hypokalemia: Secondary | ICD-10-CM

## 2016-01-11 MED ORDER — ALBUTEROL SULFATE (2.5 MG/3ML) 0.083% IN NEBU
2.5000 mg | INHALATION_SOLUTION | Freq: Once | RESPIRATORY_TRACT | Status: DC
Start: 2016-01-11 — End: 2016-01-11

## 2016-01-11 MED ORDER — PREDNISONE 20 MG PO TABS: ORAL_TABLET | ORAL | Status: DC

## 2016-01-11 MED ORDER — HYDROCODONE-HOMATROPINE 5-1.5 MG/5ML PO SYRP: 5.0000 mL | ORAL_SOLUTION | Freq: Three times a day (TID) | ORAL | Status: DC | PRN

## 2016-01-11 MED ORDER — IPRATROPIUM-ALBUTEROL 0.5-2.5 (3) MG/3ML IN SOLN
3.0000 mL | Freq: Once | RESPIRATORY_TRACT | Status: AC
  Administered 2016-01-11: 3 mL via RESPIRATORY_TRACT

## 2016-01-11 MED ORDER — IPRATROPIUM BROMIDE 0.02 % IN SOLN: 0.5000 mg | Freq: Once | RESPIRATORY_TRACT | Status: DC

## 2016-01-11 NOTE — Telephone Encounter (Signed)
Per Dr. Lorelei Pont have patient come in today for appointment. Appointment scheduled for 11:30 am. Patient notified and agreed to come in today.

## 2016-01-11 NOTE — Progress Notes (Signed)
Pre visit review using our clinic review tool, if applicable. No additional management support is needed unless otherwise documented below in the visit note. 

## 2016-01-11 NOTE — Patient Instructions (Addendum)
It was good to see you today- I hope that you are feeling much better soon! Use the prednisone as directed for the next 6 days, and use the cough syrup as needed but remember it will make you feel sleepy. Do not use this when you are driving

## 2016-01-11 NOTE — Progress Notes (Signed)
[place] at [facility] [address] [place], [place] [address] [phone]  Date:  01/11/2016   Name:  Gabriela Torres   DOB:  September 13, 1970   MRN:  [id number]  PCP:  Lamar Blinks, MD    Chief Complaint: Shortness of Breath   History of Present Illness:  Gabriela Torres is a 45 y.o. very pleasant female patient who presents with the following:  History of controlled DM, HTN. She was seen here on 6/21 with complaint of cough. She had started a zpack the day prior to her last visit.     We gave her albuterol inhaler and hycodan.   She had a negative CXR on 6/29 and was given tramadol to use for painful cough. However this did not seem to help her  She notes that she continues to cough, and the cough can be painful.  She is feeling SOB due to cough and chest congestion.   She is coughing up clear mucus.  She will have "coughing fits, and it's hard."  She feels like she will cough until she nearly blacks out sometimes.  She is not sleeping well due to cough She is getting a headache also during the day.    S/p hysterectomy.    She is just using albuterol now for her cough.  Hycodan did help her, but tramadol did not.  Dg Chest 2 View  01/05/2016  CLINICAL DATA:  Back pain and cough. EXAM: CHEST  2 VIEW COMPARISON:  May 18, 2012 FINDINGS: The heart, hila, mediastinum, lungs, and pleura are normal. Splaying of the posterior right fourth and fifth ribs is unchanged, possibly sequela of remote trauma. IMPRESSION: No active cardiopulmonary disease. Electronically Signed   By: Dorise Bullion III M.D   On: 01/05/2016 01:19   Lab Results  Component Value Date   HGBA1C 6.7* 12/18/2015   Recently dx with diabetes and started on metformin.  A1c as above  Patient Active Problem List   Diagnosis Date Noted  . Diabetes mellitus type 2, controlled, without complications ([place]) Q000111Q  . Pre-diabetes 08/19/2015  . Overweight  08/19/2015  . Migraine 04/17/2013  . HTN (hypertension) 05/18/2012  . Hyperlipidemia 05/18/2012  . Ventral hernia 09/03/2011    Past Medical History  Diagnosis Date  . Abdominal wall hernia   . Hypertension   . Hyperlipidemia   . Abdominal pain   . Hearing loss   . Migraines   . Anxiety     Past Surgical History  Procedure Laterality Date  . Tonsillectomy  2008  . Nasal turbinate reduction  2000  . Abdominal hysterectomy  09/2003 or 09/2004     total   . Ventral hernia repair  11/01/2011    Procedure: LAPAROSCOPIC VENTRAL HERNIA;  Surgeon: Merrie Roof, MD;  Location: [facility];  Service: General;  Laterality: N/A;  . Hernia repair      Social History  Substance Use Topics  . Smoking status: Former Smoker -- 0.25 packs/day for 1 years    Types: Cigarettes    Quit date: 10/24/1981  . Smokeless tobacco: Never Used     Comment: social drinker  . Alcohol Use: Yes     Comment: 3 - 4 per week    Family History  Problem Relation Age of Onset  . Cancer Mother     abdominal  . Cancer Father     colon  . Heart disease Sister  chf    Allergies  Allergen Reactions  . Statins Other (See Comments)    Left lower and rear flank pain.  . Lisinopril     Angioedema   . Norvasc [Amlodipine] Other (See Comments)    Gum hyperplasia  . Food Nausea Only    Dill, rye  . Guaifenesin & Derivatives Nausea And Vomiting    Medication list has been reviewed and updated.  Current Outpatient Prescriptions on File Prior to Visit  Medication Sig Dispense Refill  . albuterol (PROVENTIL HFA;VENTOLIN HFA) 108 (90 Base) MCG/ACT inhaler Inhale 2 puffs into the lungs every 6 (six) hours as needed for wheezing or shortness of breath. 1 Inhaler 0  . ALPRAZolam (XANAX) 1 MG tablet Take 1 mg by mouth 3 (three) times daily as needed for anxiety.    Marland Kitchen amphetamine-dextroamphetamine (ADDERALL) 20 MG tablet Take 20 mg by mouth daily.    . DiphenhydrAMINE HCl (BENADRYL ALLERGY PO) Take 25 mg by  mouth. Reported on 08/16/2015    . EPINEPHrine 0.3 mg/0.3 mL IJ SOAJ injection Inject 0.3 mLs (0.3 mg total) into the muscle once. 1 Device 2  . escitalopram (LEXAPRO) 20 MG tablet Take 20 mg by mouth daily.    . hydrochlorothiazide (HYDRODIURIL) 25 MG tablet Take 2 tablets daily 180 tablet 3  . metFORMIN (GLUCOPHAGE) 500 MG tablet Start with one pill daily and increase to 2 pills daily (together or divided) after 2-3 weeks 180 tablet 3  . potassium chloride SA (K-DUR,KLOR-CON) 20 MEQ tablet Take 1 tablet (20 mEq total) by mouth daily. 30 tablet 3   No current facility-administered medications on file prior to visit.    Review of Systems:  As per HPI- otherwise negative.   Physical Examination: Filed Vitals:   01/11/16 1200  BP: 143/83  Pulse: 76  Temp: 98.6 F (37 C)   Filed Vitals:   01/11/16 1200  Height: 5\' 5"  (1.651 m)  Weight: 243 lb 3.2 oz (110.315 kg)   Body mass index is 40.47 kg/(m^2). Ideal Body Weight: Weight in (lb) to have BMI = 25: 149.9  GEN: WDWN, NAD, Non-toxic, A & O x 3, obese, looks well, coughing quite a bit in room HEENT: Atraumatic, Normocephalic. Neck supple. No masses, No LAD.  Bilateral TM wnl, oropharynx normal.  PEERL,EOMI.   Ears and Nose: No external deformity. CV: RRR, No M/G/R. No JVD. No thrill. No extra heart sounds. PULM: minimal wheezing and decreased air movement. No retractions. No resp. distress. No accessory muscle use. ABD: S, NT, ND EXTR: No c/c/e NEURO Normal gait.  PSYCH: Normally interactive. Conversant. Not depressed or anxious appearing.  Calm demeanor.   Given duoneb treatment: she felt a lot better and moved better air  Assessment and Plan: Cough - Plan: albuterol (PROVENTIL) (2.5 MG/3ML) 0.083% nebulizer solution 2.5 mg, ipratropium (ATROVENT) nebulizer solution 0.5 mg, HYDROcodone-homatropine (HYCODAN) 5-1.5 MG/5ML syrup  Chest congestion - Plan: albuterol (PROVENTIL) (2.5 MG/3ML) 0.083% nebulizer solution 2.5 mg,  ipratropium (ATROVENT) nebulizer solution 0.5 mg  Bronchospasm - Plan: predniSONE (DELTASONE) 20 MG tablet  Persistent cough and bronchospasm following recent illness.  No evidence of bacterial infection or other serious pathology at this time Prednisone as directed, continue albuterol, refilled her hycodan to use prn for cough Meds ordered this encounter  Medications  . DISCONTD: albuterol (PROVENTIL) (2.5 MG/3ML) 0.083% nebulizer solution 2.5 mg    Sig:   . DISCONTD: ipratropium (ATROVENT) nebulizer solution 0.5 mg    Sig:   . predniSONE (DELTASONE)  20 MG tablet    Sig: Take 2 pills daily for 3 days, then 1 pill daily for 3 days    Dispense:  9 tablet    Refill:  0  . HYDROcodone-homatropine (HYCODAN) 5-1.5 MG/5ML syrup    Sig: Take 5 mLs by mouth every 8 (eight) hours as needed for cough.    Dispense:  90 mL    Refill:  0  . ipratropium-albuterol (DUONEB) 0.5-2.5 (3) MG/3ML nebulizer solution 3 mL    Sig:     Sent mychart message:  Hi Gabriela Torres, I hope that you are feeling better- please keep me posted!   I realized 2 things-  1. We forgot to recheck your potassium today. Please make a lab visit appt soon to have this done.   2. As you are new to diabetes, you likely do not know that prednisone will raise your blood sugar.  However this is temporary and I think necessary to clear up your lungs.  Do watch your diet while you are on the prednisone and avoid eating sweets.   Best, JC  Signed Lamar Blinks, MD

## 2016-01-15 ENCOUNTER — Ambulatory Visit: Payer: Self-pay | Admitting: Family Medicine

## 2016-01-16 ENCOUNTER — Encounter: Payer: Self-pay | Admitting: Family Medicine

## 2016-01-17 ENCOUNTER — Other Ambulatory Visit (INDEPENDENT_AMBULATORY_CARE_PROVIDER_SITE_OTHER): Payer: BLUE CROSS/BLUE SHIELD

## 2016-01-17 ENCOUNTER — Telehealth: Payer: Self-pay | Admitting: *Deleted

## 2016-01-17 DIAGNOSIS — E876 Hypokalemia: Secondary | ICD-10-CM | POA: Diagnosis not present

## 2016-01-17 LAB — BASIC METABOLIC PANEL
BUN: 18 mg/dL (ref 6–23)
CALCIUM: 9.2 mg/dL (ref 8.4–10.5)
CO2: 33 mEq/L — ABNORMAL HIGH (ref 19–32)
Chloride: 100 mEq/L (ref 96–112)
Creatinine, Ser: 0.71 mg/dL (ref 0.40–1.20)
GFR: 113.8 mL/min (ref >60.00)
Glucose, Bld: 91 mg/dL (ref 70–99)
Potassium: 2.8 mEq/L — CL (ref 3.5–5.1)
SODIUM: 139 meq/L (ref 135–145)

## 2016-01-17 NOTE — Telephone Encounter (Signed)
Noted will address today

## 2016-01-17 NOTE — Telephone Encounter (Signed)
[facility] lab reporting a critical, pts potassium @ 2.8

## 2016-01-17 NOTE — Telephone Encounter (Signed)
Copy of report given to Dr. Lorelei Pont.

## 2016-01-17 NOTE — Progress Notes (Signed)
Patient ID: Gabriela Torres, female   DOB: Feb 01, 1971, 45 y.o.   MRN: [id number] Noted k is now lower.  Called pt but had to [facility] Please stop HCTZ totally We need to replete her K; I will call her back to discuss.  If she prefers it would also be reasonable to go to the ER now to have her potassium replaced and monitored.

## 2016-01-18 ENCOUNTER — Ambulatory Visit (INDEPENDENT_AMBULATORY_CARE_PROVIDER_SITE_OTHER): Payer: BLUE CROSS/BLUE SHIELD | Admitting: Family Medicine

## 2016-01-18 ENCOUNTER — Encounter: Payer: Self-pay | Admitting: Family Medicine

## 2016-01-18 ENCOUNTER — Telehealth: Payer: Self-pay | Admitting: Emergency Medicine

## 2016-01-18 VITALS — BP 138/80 | Temp 98.1°F | Ht 65.0 in | Wt 242.0 lb

## 2016-01-18 DIAGNOSIS — I10 Essential (primary) hypertension: Secondary | ICD-10-CM

## 2016-01-18 DIAGNOSIS — E876 Hypokalemia: Secondary | ICD-10-CM

## 2016-01-18 LAB — POTASSIUM: Potassium: 2.7 mEq/L — CL (ref 3.5–5.1)

## 2016-01-18 LAB — TROPONIN I: TNIDX: 0.02 ug/l (ref 0.00–0.06)

## 2016-01-18 LAB — MAGNESIUM: Magnesium: 2.1 mg/dL (ref 1.5–2.5)

## 2016-01-18 MED ORDER — CARVEDILOL 6.25 MG PO TABS: 6.2500 mg | ORAL_TABLET | Freq: Two times a day (BID) | ORAL | Status: DC

## 2016-01-18 NOTE — Telephone Encounter (Signed)
addressed

## 2016-01-18 NOTE — Telephone Encounter (Signed)
Hope from [place] [facility] called Critical Potassium Level 2.7 @ 1334....KMP

## 2016-01-18 NOTE — Progress Notes (Signed)
Pre visit review using our clinic review tool, if applicable. No additional management support is needed unless otherwise documented below in the visit note. 

## 2016-01-18 NOTE — Progress Notes (Signed)
[place] at [facility] [address] [place], [place] [address] [phone]  Date:  01/18/2016   Name:  Gabriela Torres   DOB:  [birth year]   MRN:  [id number]  PCP:  Lamar Blinks, MD    Chief Complaint: Follow-up   History of Present Illness:  Gabriela Torres is a 45 y.o. very pleasant female patient who presents with the following:  Pt seen for a routine visit about one month ago- at that time she had increased her HCTZ from 25- 50mg /day on her own.  (We have had trouble getting her BP under control as she had angioedema with ACE-I and had gum hyperplasia with CCB; her BP was well controlled with 50 of HCTZ).  Her K was noted to be low at 3, we started K replacement.  Yesterday K found to be lower at 2.8 so she was asked to come in for an evaluation today.   She has not noted any weakness, SOB, CP, palpitations.  Overall she feels fine and does not think she has any sx of hypokalemia She does not recall using a BB in the past so this would be a possibilty for her  She has recently been sick. Her respiratory sx are now much better- she is still coughing a bit but this is imporved No CP She will use the albuterol at times- she may use it once a day, not every day  Patient Active Problem List   Diagnosis Date Noted  . Diabetes mellitus type 2, controlled, without complications ([place]) Q000111Q  . Overweight 08/19/2015  . Migraine 04/17/2013  . HTN (hypertension) 05/18/2012  . Hyperlipidemia 05/18/2012  . Ventral hernia 09/03/2011    Past Medical History  Diagnosis Date  . Abdominal wall hernia   . Hypertension   . Hyperlipidemia   . Abdominal pain   . Hearing loss   . Migraines   . Anxiety     Past Surgical History  Procedure Laterality Date  . Tonsillectomy  2008  . Nasal turbinate reduction  2000  . Abdominal hysterectomy  09/2003 or 09/2004     total   . Ventral hernia repair  11/01/2011    Procedure: LAPAROSCOPIC  VENTRAL HERNIA;  Surgeon: Merrie Roof, MD;  Location: [facility];  Service: General;  Laterality: N/A;  . Hernia repair      Social History  Substance Use Topics  . Smoking status: Former Smoker -- 0.25 packs/day for 1 years    Types: Cigarettes    Quit date: 10/24/1981  . Smokeless tobacco: Never Used     Comment: social drinker  . Alcohol Use: Yes     Comment: 3 - 4 per week    Family History  Problem Relation Age of Onset  . Cancer Mother     abdominal  . Cancer Father     colon  . Heart disease Sister     chf    Allergies  Allergen Reactions  . Statins Other (See Comments)    Left lower and rear flank pain.  . Lisinopril     Angioedema   . Norvasc [Amlodipine] Other (See Comments)    Gum hyperplasia  . Food Nausea Only    Dill, rye  . Guaifenesin & Derivatives Nausea And Vomiting    Medication list has been reviewed and updated.  Current Outpatient Prescriptions on File Prior to Visit  Medication Sig Dispense Refill  . albuterol (PROVENTIL HFA;VENTOLIN  HFA) 108 (90 Base) MCG/ACT inhaler Inhale 2 puffs into the lungs every 6 (six) hours as needed for wheezing or shortness of breath. 1 Inhaler 0  . ALPRAZolam (XANAX) 1 MG tablet Take 1 mg by mouth 3 (three) times daily as needed for anxiety.    Marland Kitchen amphetamine-dextroamphetamine (ADDERALL) 20 MG tablet Take 20 mg by mouth daily.    . DiphenhydrAMINE HCl (BENADRYL ALLERGY PO) Take 25 mg by mouth. Reported on 08/16/2015    . EPINEPHrine 0.3 mg/0.3 mL IJ SOAJ injection Inject 0.3 mLs (0.3 mg total) into the muscle once. 1 Device 2  . escitalopram (LEXAPRO) 20 MG tablet Take 20 mg by mouth daily.    . hydrochlorothiazide (HYDRODIURIL) 25 MG tablet Take 2 tablets daily 180 tablet 3  . HYDROcodone-homatropine (HYCODAN) 5-1.5 MG/5ML syrup Take 5 mLs by mouth every 8 (eight) hours as needed for cough. 90 mL 0  . metFORMIN (GLUCOPHAGE) 500 MG tablet Start with one pill daily and increase to 2 pills daily (together or divided)  after 2-3 weeks 180 tablet 3  . potassium chloride SA (K-DUR,KLOR-CON) 20 MEQ tablet Take 1 tablet (20 mEq total) by mouth daily. 30 tablet 3  . predniSONE (DELTASONE) 20 MG tablet Take 2 pills daily for 3 days, then 1 pill daily for 3 days 9 tablet 0   No current facility-administered medications on file prior to visit.    Review of Systems:  As per HPI- otherwise negative.   Physical Examination: Filed Vitals:   01/18/16 1055  Temp: 98.1 F (36.7 C)   Filed Vitals:   01/18/16 1055  Weight: 242 lb (109.77 kg)   Body mass index is 40.27 kg/(m^2). Ideal Body Weight:    GEN: WDWN, NAD, Non-toxic, A & O x 3, overweight, looks well HEENT: Atraumatic, Normocephalic. Neck supple. No masses, No LAD. Ears and Nose: No external deformity. CV: RRR, No M/G/R. No JVD. No thrill. No extra heart sounds. PULM: CTA B, no wheezes, crackles, rhonchi. No retractions. No resp. distress. No accessory muscle use. ABD: S, NT, ND EXTR: No c/c/e NEURO Normal gait.  PSYCH: Normally interactive. Conversant. Not depressed or anxious appearing.  Calm demeanor.   EKG:  NSR, no arrhythmia.  She does have ST elevation c/w early repol but this is stable from past readings.  No evidence of hypokalemia related changes such as ST depression, T wave inversion or prominent U waves  Results for orders placed or performed in visit on 01/18/16  Potassium  Result Value Ref Range   Potassium 2.7 (LL) 3.5 - 5.1 mEq/L  Magnesium  Result Value Ref Range   Magnesium 2.1 1.5 - 2.5 mg/dL  Troponin I  Result Value Ref Range   TNIDX 0.02 0.00 - 0.06 ug/l     Assessment and Plan: Hypokalemia - Plan: EKG 12-Lead, Potassium, Magnesium, Troponin I  Essential hypertension - Plan: carvedilol (COREG) 6.25 MG tablet  Hypokalemia- recheck today.  It is about the same as yesterday. Magnesium is normal and troponin normal. Plan is to  1. Stop HCTZ now, may start on coreg tomorrow for BP control 2. 40 meq K dur BID today  and tomorrow 3. May restart HCTZ 25 mg daily and take 40 mg of K dur daily following that 4. Repeat K next week  Have been unable to speak with pt directly today to discuss plan, but has left message on her phone and on mychart asking her to take K 40 meq twice today. Will continue to try  and get in touch with her  Signed Lamar Blinks, MD

## 2016-01-18 NOTE — Patient Instructions (Signed)
I will call you with your potassium and magnesium level today Stop HCTZ- start the carvedilol tomorrow.  Take 69meq of potassium now, will likely have you take another dose later today

## 2016-01-23 ENCOUNTER — Other Ambulatory Visit (INDEPENDENT_AMBULATORY_CARE_PROVIDER_SITE_OTHER): Payer: BLUE CROSS/BLUE SHIELD

## 2016-01-23 DIAGNOSIS — E876 Hypokalemia: Secondary | ICD-10-CM | POA: Diagnosis not present

## 2016-01-23 LAB — BASIC METABOLIC PANEL
BUN: 13 mg/dL (ref 6–23)
CALCIUM: 9.4 mg/dL (ref 8.4–10.5)
CHLORIDE: 103 meq/L (ref 96–112)
CO2: 29 mEq/L (ref 19–32)
CREATININE: 0.63 mg/dL (ref 0.40–1.20)
GFR: 130.62 mL/min (ref >60.00)
Glucose, Bld: 92 mg/dL (ref 70–99)
Potassium: 3.5 mEq/L (ref 3.5–5.1)
Sodium: 138 mEq/L (ref 135–145)

## 2016-01-24 ENCOUNTER — Encounter: Payer: Self-pay | Admitting: Family Medicine

## 2016-01-24 DIAGNOSIS — E876 Hypokalemia: Secondary | ICD-10-CM

## 2016-01-24 DIAGNOSIS — J9801 Acute bronchospasm: Secondary | ICD-10-CM

## 2016-01-25 ENCOUNTER — Encounter: Payer: Self-pay | Admitting: Family Medicine

## 2016-01-25 MED ORDER — FLUTICASONE PROPIONATE HFA 110 MCG/ACT IN AERO: 1.0000 | INHALATION_SPRAY | Freq: Two times a day (BID) | RESPIRATORY_TRACT | Status: DC

## 2016-01-25 MED ORDER — MONTELUKAST SODIUM 10 MG PO TABS: 10.0000 mg | ORAL_TABLET | Freq: Every day | ORAL | Status: DC

## 2016-01-29 ENCOUNTER — Other Ambulatory Visit: Payer: Self-pay | Admitting: Emergency Medicine

## 2016-01-29 ENCOUNTER — Telehealth: Payer: Self-pay | Admitting: Emergency Medicine

## 2016-01-29 ENCOUNTER — Encounter: Payer: Self-pay | Admitting: Family Medicine

## 2016-01-30 ENCOUNTER — Other Ambulatory Visit (INDEPENDENT_AMBULATORY_CARE_PROVIDER_SITE_OTHER): Payer: BLUE CROSS/BLUE SHIELD

## 2016-01-30 DIAGNOSIS — E876 Hypokalemia: Secondary | ICD-10-CM

## 2016-01-30 LAB — BASIC METABOLIC PANEL
BUN: 17 mg/dL (ref 6–23)
CALCIUM: 9.4 mg/dL (ref 8.4–10.5)
CO2: 31 mEq/L (ref 19–32)
CREATININE: 0.59 mg/dL (ref 0.40–1.20)
Chloride: 104 mEq/L (ref 96–112)
GFR: 140.88 mL/min (ref >60.00)
GLUCOSE: 102 mg/dL — AB (ref 70–99)
Potassium: 3.4 mEq/L — ABNORMAL LOW (ref 3.5–5.1)
SODIUM: 142 meq/L (ref 135–145)

## 2016-02-06 NOTE — Telephone Encounter (Signed)
error 

## 2016-02-14 ENCOUNTER — Telehealth: Payer: Self-pay | Admitting: Family Medicine

## 2016-02-14 ENCOUNTER — Other Ambulatory Visit: Payer: Self-pay | Admitting: Emergency Medicine

## 2016-02-14 ENCOUNTER — Encounter: Payer: Self-pay | Admitting: Family Medicine

## 2016-02-14 DIAGNOSIS — E876 Hypokalemia: Secondary | ICD-10-CM

## 2016-02-14 NOTE — Telephone Encounter (Signed)
Order placed for BMP. Pt states that she will come in tomorrow for lab work.

## 2016-02-14 NOTE — Telephone Encounter (Signed)
Relation to [id number] Call back number:[phone]   Reason for call:  Patient states there should be standard BMP orders and would like to schedule lab appointment. Chart doesn't reflect order please advise

## 2016-02-15 ENCOUNTER — Other Ambulatory Visit (INDEPENDENT_AMBULATORY_CARE_PROVIDER_SITE_OTHER): Payer: BLUE CROSS/BLUE SHIELD

## 2016-02-15 ENCOUNTER — Encounter: Payer: Self-pay | Admitting: Family Medicine

## 2016-02-15 DIAGNOSIS — E876 Hypokalemia: Secondary | ICD-10-CM | POA: Diagnosis not present

## 2016-02-15 LAB — BASIC METABOLIC PANEL
BUN: 16 mg/dL (ref 6–23)
CHLORIDE: 103 meq/L (ref 96–112)
CO2: 31 meq/L (ref 19–32)
Calcium: 9.6 mg/dL (ref 8.4–10.5)
Creatinine, Ser: 0.71 mg/dL (ref 0.40–1.20)
GFR: 113.76 mL/min (ref >60.00)
GLUCOSE: 91 mg/dL (ref 70–99)
Potassium: 3.5 mEq/L (ref 3.5–5.1)
SODIUM: 141 meq/L (ref 135–145)

## 2016-03-18 ENCOUNTER — Encounter: Payer: Self-pay | Admitting: Family Medicine

## 2016-03-20 ENCOUNTER — Ambulatory Visit (INDEPENDENT_AMBULATORY_CARE_PROVIDER_SITE_OTHER): Payer: BLUE CROSS/BLUE SHIELD | Admitting: Family Medicine

## 2016-03-20 ENCOUNTER — Encounter: Payer: Self-pay | Admitting: Family Medicine

## 2016-03-20 VITALS — BP 160/92 | HR 82 | Temp 98.7°F | Ht 65.0 in | Wt 247.0 lb

## 2016-03-20 DIAGNOSIS — E119 Type 2 diabetes mellitus without complications: Secondary | ICD-10-CM

## 2016-03-20 DIAGNOSIS — E876 Hypokalemia: Secondary | ICD-10-CM | POA: Diagnosis not present

## 2016-03-20 DIAGNOSIS — J9801 Acute bronchospasm: Secondary | ICD-10-CM

## 2016-03-20 DIAGNOSIS — I10 Essential (primary) hypertension: Secondary | ICD-10-CM | POA: Diagnosis not present

## 2016-03-20 MED ORDER — PREDNISONE 20 MG PO TABS: ORAL_TABLET | ORAL | 0 refills | Status: DC

## 2016-03-20 MED ORDER — ALBUTEROL SULFATE (2.5 MG/3ML) 0.083% IN NEBU
2.5000 mg | INHALATION_SOLUTION | Freq: Once | RESPIRATORY_TRACT | Status: AC
  Administered 2016-03-20: 2.5 mg via RESPIRATORY_TRACT

## 2016-03-20 MED ORDER — ALBUTEROL SULFATE HFA 108 (90 BASE) MCG/ACT IN AERS: 2.0000 | INHALATION_SPRAY | Freq: Four times a day (QID) | RESPIRATORY_TRACT | 3 refills | Status: DC | PRN

## 2016-03-20 NOTE — Progress Notes (Signed)
[place] at [facility], [place], [place] [address] [phone]  Date:  03/20/2016   Name:  Gabriela Torres   DOB:  12-Feb-1971   MRN:  [id number]  PCP:  Lamar Blinks, MD    Chief Complaint: Nasal Congestion (c/o nasal congestion, prod cough with thick yellow muscus, throat discomfort, sneezing, bronchial spams.  )   History of Present Illness:  Gabriela Torres is a 45 y.o. very pleasant female patient who presents with the following: Here today with concern of a recurrent respiratory issue.  It took her quite a while to get rid of this last time so she was concerned and came in to be seen In June she had a cough, used a zpack, albutrerol and hycodan.  However she continued to have cough and developed painful bronchospasm.  We eventually treated her with prednisone that did help in July, she eventually got back to normal We had a lot of trouble with hypokalemia over the same time period; she had increased her HCTZ to 50, developed hypokalemia and required close monitoring. She is now back on a lower dose of HCTZ and also coreg for her BP    Component Value Date/Time   NA 141 02/15/2016 0742   NA 138 07/14/2012 0000   K 3.5 02/15/2016 0742   CL 103 02/15/2016 0742   CO2 31 02/15/2016 0742   BUN 16 02/15/2016 0742   BUN 19 07/14/2012 0000   CREATININE 0.71 02/15/2016 0742   CREATININE 0.73 08/16/2015 0844      Component Value Date/Time   CALCIUM 9.6 02/15/2016 0742   ALKPHOS 75 12/18/2015 0951   AST 14 12/18/2015 0951   ALT 14 12/18/2015 0951   BILITOT 0.5 12/18/2015 0951     She noted onset of sx again about 48 hours ago; she first noted cough, sneezing, bringing up muscus with cough, blowing her nose a lot, "voice is starting to go now." she has nasal congestion, ST, slight wheezing only. She is just using flovent inhaler, not using any albuterol right now.  She is using HCTZ 25 and carvedilol.  She only takes  the carvedilol once a day generally  Lab Results  Component Value Date   HGBA1C 6.7 (H) 12/18/2015   She does not generally check her glucose.  Her DM has been well controlled in the past She did not have asthma as a child, never dx with asthma in the past  Patient Active Problem List   Diagnosis Date Noted  . Diabetes mellitus type 2, controlled, without complications ([place]) [id number]  . Overweight 08/19/2015  . Migraine 04/17/2013  . HTN (hypertension) 05/18/2012  . Hyperlipidemia 05/18/2012  . Ventral hernia 09/03/2011    Past Medical History:  Diagnosis Date  . Abdominal pain   . Abdominal wall hernia   . Anxiety   . Hearing loss   . Hyperlipidemia   . Hypertension   . Migraines     Past Surgical History:  Procedure Laterality Date  . ABDOMINAL HYSTERECTOMY  09/2003 or 09/2004    total   . HERNIA REPAIR    . NASAL TURBINATE REDUCTION  2000  . TONSILLECTOMY  2008  . VENTRAL HERNIA REPAIR  11/01/2011   Procedure: LAPAROSCOPIC VENTRAL HERNIA;  Surgeon: Merrie Roof, MD;  Location: [facility] OR;  Service: General;  Laterality: N/A;    Social History  Substance Use Topics  . Smoking status: Former Smoker  Packs/day: 0.25    Years: 1.00    Types: Cigarettes    Quit date: 10/24/1981  . Smokeless tobacco: Never Used     Comment: social drinker  . Alcohol use Yes     Comment: 3 - 4 per week    Family History  Problem Relation Age of Onset  . Cancer Mother     abdominal  . Cancer Father     colon  . Heart disease Sister     chf    Allergies  Allergen Reactions  . Statins Other (See Comments)    Left lower and rear flank pain.  . Lisinopril     Angioedema   . Norvasc [Amlodipine] Other (See Comments)    Gum hyperplasia  . Food Nausea Only    Dill, rye  . Guaifenesin & Derivatives Nausea And Vomiting    Medication list has been reviewed and updated.  Current Outpatient Prescriptions on File Prior to Visit  Medication Sig Dispense Refill  . albuterol  (PROVENTIL HFA;VENTOLIN HFA) 108 (90 Base) MCG/ACT inhaler Inhale 2 puffs into the lungs every 6 (six) hours as needed for wheezing or shortness of breath. 1 Inhaler 0  . ALPRAZolam (XANAX) 1 MG tablet Take 1 mg by mouth 3 (three) times daily as needed for anxiety.    Marland Kitchen amphetamine-dextroamphetamine (ADDERALL) 20 MG tablet Take 20 mg by mouth daily.    . carvedilol (COREG) 6.25 MG tablet Take 1 tablet (6.25 mg total) by mouth 2 (two) times daily with a meal. 60 tablet 3  . DiphenhydrAMINE HCl (BENADRYL ALLERGY PO) Take 25 mg by mouth. Reported on 08/16/2015    . EPINEPHrine 0.3 mg/0.3 mL IJ SOAJ injection Inject 0.3 mLs (0.3 mg total) into the muscle once. 1 Device 2  . escitalopram (LEXAPRO) 20 MG tablet Take 20 mg by mouth daily.    . hydrochlorothiazide (HYDRODIURIL) 25 MG tablet Take 2 tablets daily 180 tablet 3  . metFORMIN (GLUCOPHAGE) 500 MG tablet Start with one pill daily and increase to 2 pills daily (together or divided) after 2-3 weeks 180 tablet 3  . montelukast (SINGULAIR) 10 MG tablet Take 1 tablet (10 mg total) by mouth at bedtime. 30 tablet 3  . potassium chloride SA (K-DUR,KLOR-CON) 20 MEQ tablet Take 1 tablet (20 mEq total) by mouth daily. 30 tablet 3   No current facility-administered medications on file prior to visit.     Review of Systems:  As per HPI- otherwise negative. No fever, chills, nausea, vomiting, CP or SOB  Physical Examination: Vitals:   03/20/16 1634  BP: (!) 154/105  Pulse: 82  Temp: 98.7 F (37.1 C)   Vitals:   03/20/16 1634  Weight: 247 lb (112 kg)  Height: 5\' 5"  (1.651 m)   Body mass index is 41.1 kg/m. Ideal Body Weight: Weight in (lb) to have BMI = 25: 149.9  GEN: WDWN, NAD, Non-toxic, A & O x 3, overweight, looks well, occasional cough HEENT: Atraumatic, Normocephalic. Neck supple. No masses, No LAD.  Bilateral TM wnl, oropharynx normal.  PEERL,EOMI.   Ears and Nose: No external deformity. CV: RRR, No M/G/R. No JVD. No thrill. No extra  heart sounds. PULM: no crackles, rhonchi. No retractions. No resp. distress. No accessory muscle use. Mild bilateral wheezing on exam ABD: S, NT, ND EXTR: No c/c/e NEURO Normal gait.  PSYCH: Normally interactive. Conversant. Not depressed or anxious appearing.  Calm demeanor.   Given albuterol neb- she felt better and wheezing resolved Assessment and  Plan: Bronchospasm - Plan: albuterol (PROVENTIL HFA;VENTOLIN HFA) 108 (90 Base) MCG/ACT inhaler, albuterol (PROVENTIL) (2.5 MG/3ML) 0.083% nebulizer solution 2.5 mg, predniSONE (DELTASONE) 20 MG tablet  Hypokalemia - Plan: Basic metabolic panel  Essential hypertension  Controlled type 2 diabetes mellitus without complication, without long-term current use of insulin (HCC) - Plan: Hemoglobin A1c  Here today with return of congestion, cough and wheezing. She is afraid of getting as sick as she was over the summer.  Will treat her early with a short course of prednisone- she tolerated this well last time without symptoms of hyperglycemia Her BP is a bit high today but this may be due to illness; she plans to start monitoring her BP at home Will check her CMP and A1c today She will follow-up with me closely if not getting better, will keep me posted about her progress  Meds ordered this encounter  Medications  . albuterol (PROVENTIL HFA;VENTOLIN HFA) 108 (90 Base) MCG/ACT inhaler    Sig: Inhale 2 puffs into the lungs every 6 (six) hours as needed for wheezing or shortness of breath.    Dispense:  1 Inhaler    Refill:  3  . albuterol (PROVENTIL) (2.5 MG/3ML) 0.083% nebulizer solution 2.5 mg  . predniSONE (DELTASONE) 20 MG tablet    Sig: Take 2 pills a day for 3 days, then 1 pill a day for 3 days    Dispense:  9 tablet    Refill:  0     Signed Lamar Blinks, MD

## 2016-03-20 NOTE — Patient Instructions (Signed)
We are going to use another course of prednisone to help your breathing- you can also you use your albuterol as needed for cough and wheeze. If you are not improving in the next couple of days let me know  We will check your potassium and A1c

## 2016-03-21 ENCOUNTER — Encounter: Payer: Self-pay | Admitting: Family Medicine

## 2016-03-21 LAB — BASIC METABOLIC PANEL
BUN: 19 mg/dL (ref 6–23)
CO2: 30 mEq/L (ref 19–32)
Calcium: 9.1 mg/dL (ref 8.4–10.5)
Chloride: 103 mEq/L (ref 96–112)
Creatinine, Ser: 0.65 mg/dL (ref 0.40–1.20)
GFR: 125.91 mL/min (ref >60.00)
GLUCOSE: 61 mg/dL — AB (ref 70–99)
POTASSIUM: 3.8 meq/L (ref 3.5–5.1)
SODIUM: 141 meq/L (ref 135–145)

## 2016-03-21 LAB — HEMOGLOBIN A1C: HEMOGLOBIN A1C: 5.9 % (ref 4.6–6.5)

## 2016-03-22 ENCOUNTER — Encounter: Payer: Self-pay | Admitting: Family Medicine

## 2016-03-22 DIAGNOSIS — J9801 Acute bronchospasm: Secondary | ICD-10-CM

## 2016-03-25 ENCOUNTER — Ambulatory Visit: Payer: BLUE CROSS/BLUE SHIELD | Admitting: Family Medicine

## 2016-03-26 MED ORDER — PREDNISONE 20 MG PO TABS: ORAL_TABLET | ORAL | 0 refills | Status: DC

## 2016-03-26 NOTE — Addendum Note (Signed)
Addended by: Lamar Blinks C on: 03/26/2016 06:41 AM   Modules accepted: Orders

## 2016-04-19 ENCOUNTER — Encounter: Payer: Self-pay | Admitting: Family Medicine

## 2016-04-19 NOTE — Telephone Encounter (Signed)
-----   Message from [name], [place] sent at 04/19/2016  1:29 PM EDT ----- Gabriela Torres  to Darreld Mclean, MD     04/19/16 1:14 PM  Dr. Lorelei Pont,     Hi , I hope that you are doing well. I have a quick question.Marland KitchenMarland KitchenDo I still have a standing order in for lab work? I'm wondering because I feel Horrible.. Just tired , exhausted and Bleech! No energy and just overall not feeling good. I'm not in any pain really I just feel whipped. Any ideas? Could it be the Metformin? I'm Just curious to know your thoughts and professional advice.   Thanks      Me to: Gabriela Torres Gabriela Torres  04/19/16 1:28 PM   Hi Gabriela Torres,   Dr.Genice Kimberlin is out of the office today. I did check and it looks like there is only one standing order and it's for a BMP. I will forward your message to Dr. Lorelei Pont to get her advise on what could be causing you to feel bad. As soon as I hear something I will let you know. Have a great weekend.   [name], CMA

## 2016-04-27 ENCOUNTER — Other Ambulatory Visit: Payer: Self-pay | Admitting: Family Medicine

## 2016-04-27 DIAGNOSIS — E876 Hypokalemia: Secondary | ICD-10-CM

## 2016-04-29 ENCOUNTER — Other Ambulatory Visit: Payer: Self-pay | Admitting: Emergency Medicine

## 2016-04-29 DIAGNOSIS — E876 Hypokalemia: Secondary | ICD-10-CM

## 2016-04-29 MED ORDER — POTASSIUM CHLORIDE CRYS ER 20 MEQ PO TBCR: 20.0000 meq | EXTENDED_RELEASE_TABLET | Freq: Every day | ORAL | 3 refills | Status: DC

## 2016-05-07 ENCOUNTER — Other Ambulatory Visit (INDEPENDENT_AMBULATORY_CARE_PROVIDER_SITE_OTHER): Payer: BLUE CROSS/BLUE SHIELD

## 2016-05-07 DIAGNOSIS — E876 Hypokalemia: Secondary | ICD-10-CM | POA: Diagnosis not present

## 2016-05-07 LAB — BASIC METABOLIC PANEL
BUN: 12 mg/dL (ref 6–23)
CALCIUM: 9.5 mg/dL (ref 8.4–10.5)
CO2: 29 mEq/L (ref 19–32)
CREATININE: 0.52 mg/dL (ref 0.40–1.20)
Chloride: 100 mEq/L (ref 96–112)
GFR: 162.8 mL/min (ref >60.00)
GLUCOSE: 120 mg/dL — AB (ref 70–99)
Potassium: 3.5 mEq/L (ref 3.5–5.1)
SODIUM: 138 meq/L (ref 135–145)

## 2016-07-13 ENCOUNTER — Other Ambulatory Visit: Payer: Self-pay | Admitting: Family Medicine

## 2016-07-13 DIAGNOSIS — I10 Essential (primary) hypertension: Secondary | ICD-10-CM

## 2016-07-15 ENCOUNTER — Other Ambulatory Visit: Payer: Self-pay | Admitting: Emergency Medicine

## 2016-07-15 DIAGNOSIS — I10 Essential (primary) hypertension: Secondary | ICD-10-CM

## 2016-07-15 MED ORDER — CARVEDILOL 6.25 MG PO TABS: 6.2500 mg | ORAL_TABLET | Freq: Two times a day (BID) | ORAL | 2 refills | Status: DC

## 2016-07-18 ENCOUNTER — Other Ambulatory Visit: Payer: Self-pay | Admitting: Emergency Medicine

## 2016-07-18 ENCOUNTER — Encounter: Payer: Self-pay | Admitting: Family Medicine

## 2016-08-07 ENCOUNTER — Encounter: Payer: Self-pay | Admitting: Family Medicine

## 2016-08-08 ENCOUNTER — Encounter: Payer: Self-pay | Admitting: Family Medicine

## 2016-08-08 ENCOUNTER — Ambulatory Visit: Payer: BLUE CROSS/BLUE SHIELD | Admitting: Family Medicine

## 2016-08-08 ENCOUNTER — Ambulatory Visit (INDEPENDENT_AMBULATORY_CARE_PROVIDER_SITE_OTHER): Payer: BLUE CROSS/BLUE SHIELD | Admitting: Family Medicine

## 2016-08-08 VITALS — BP 131/89 | HR 65 | Temp 97.6°F | Ht 65.0 in | Wt 264.0 lb

## 2016-08-08 DIAGNOSIS — J111 Influenza due to unidentified influenza virus with other respiratory manifestations: Secondary | ICD-10-CM

## 2016-08-08 DIAGNOSIS — J9801 Acute bronchospasm: Secondary | ICD-10-CM

## 2016-08-08 DIAGNOSIS — R0789 Other chest pain: Secondary | ICD-10-CM

## 2016-08-08 DIAGNOSIS — E876 Hypokalemia: Secondary | ICD-10-CM | POA: Diagnosis not present

## 2016-08-08 LAB — BASIC METABOLIC PANEL
BUN: 10 mg/dL (ref 6–23)
CALCIUM: 9 mg/dL (ref 8.4–10.5)
CO2: 32 mEq/L (ref 19–32)
Chloride: 100 mEq/L (ref 96–112)
Creatinine, Ser: 0.64 mg/dL (ref 0.40–1.20)
GFR: 127.97 mL/min (ref >60.00)
Glucose, Bld: 89 mg/dL (ref 70–99)
POTASSIUM: 3.5 meq/L (ref 3.5–5.1)
SODIUM: 139 meq/L (ref 135–145)

## 2016-08-08 MED ORDER — PREDNISONE 20 MG PO TABS: ORAL_TABLET | ORAL | 0 refills | Status: DC

## 2016-08-08 MED ORDER — IPRATROPIUM-ALBUTEROL 0.5-2.5 (3) MG/3ML IN SOLN
3.0000 mL | Freq: Once | RESPIRATORY_TRACT | Status: AC
  Administered 2016-08-08: 3 mL via RESPIRATORY_TRACT

## 2016-08-08 MED ORDER — OSELTAMIVIR PHOSPHATE 75 MG PO CAPS: 75.0000 mg | ORAL_CAPSULE | Freq: Two times a day (BID) | ORAL | 0 refills | Status: DC

## 2016-08-08 NOTE — Progress Notes (Signed)
[place] at [facility] [place], [place], [place] [address] [phone]  Date:  08/08/2016   Name:  Gabriela Torres   DOB:  August 13, 1970   MRN:  [id number]  PCP:  Lamar Blinks, MD    Chief Complaint: Cough (c/o prod cough with yellow mucus, stuffy nose, headache, bodyache, shortness of breath, fatigue, chest discomfort and chills. Sxs since this past Sunday. )   History of Present Illness:  Gabriela Torres is a 46 y.o. very pleasant female patient who presents with the following: Today is Thursday-  Patient has been feeling bad since Saturday/Sunday.  2 Brothers are sick at home (older than her).  1 brother was put on medicine, but not sure what it was.  Gabriela Torres may have had a fever, but has not checked with thermometer.  Clear nasal discharge.  Coughing up yellow mucus.  Headache for last 4 days (has been taking Advil for the last 4 days).  Bodyaches for last 4 days.  SOB has gotten worse since Monday.  Tired since this weekend.  Sleeps on 10 minutes at a time.  Alternating between chills and hot flashes.  CP due to coughing.  Slight sore throat.  Nausea but no vomiting.  Having some diarrhea.    Has used Albuterol inhaler 1 time today and 4-5 times yesterday. 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  Is looking for a job closer to home.   Her boss gave her a zpack rx which she started 2 days ago. So far it has not seemed to help but is not making her worse- no SE noted S/p hysterectomy  Has not had a flu vaccine.  Normally gets one.   Patient Active Problem List   Diagnosis Date Noted  . Diabetes mellitus type 2, controlled, without complications ([place]) [id number]  . Overweight  08/19/2015  . Migraine 04/17/2013  . HTN (hypertension) 05/18/2012  . Hyperlipidemia 05/18/2012  . Ventral hernia 09/03/2011    Past Medical History:  Diagnosis Date  . Abdominal pain   . Abdominal wall hernia   . Anxiety   . Hearing loss   . Hyperlipidemia   . Hypertension   . Migraines     Past Surgical History:  Procedure Laterality Date  . ABDOMINAL HYSTERECTOMY  09/2003 or 09/2004    total   . HERNIA REPAIR    . NASAL TURBINATE REDUCTION  2000  . TONSILLECTOMY  2008  . VENTRAL HERNIA REPAIR  11/01/2011   Procedure: LAPAROSCOPIC VENTRAL HERNIA;  Surgeon: Merrie Roof, MD;  Location: [facility] OR;  Service: General;  Laterality: N/A;    Social History  Substance Use Topics  . Smoking status: Former Smoker    Packs/day: 0.25    Years: 1.00    Types: Cigarettes    Quit date: 10/24/1981  . Smokeless tobacco: Never Used     Comment: social drinker  . Alcohol use Yes     Comment: 3 - 4 per week    Family History  Problem Relation Age of Onset  . Cancer Mother     abdominal  . Cancer Father     colon  .  Heart disease Sister     chf    Allergies  Allergen Reactions  . Statins Other (See Comments)    Left lower and rear flank pain.  . Lisinopril     Angioedema   . Norvasc [Amlodipine] Other (See Comments)    Gum hyperplasia  . Food Nausea Only    Dill, rye  . Guaifenesin & Derivatives Nausea And Vomiting    Medication list has been reviewed and updated.  Current Outpatient Prescriptions on File Prior to Visit  Medication Sig Dispense Refill  . albuterol (PROVENTIL HFA;VENTOLIN HFA) 108 (90 Base) MCG/ACT inhaler Inhale 2 puffs into the lungs every 6 (six) hours as needed for wheezing or shortness of breath. 1 Inhaler 3  . ALPRAZolam (XANAX) 1 MG tablet Take 1 mg by mouth 3 (three) times daily as needed for anxiety.    Marland Kitchen amphetamine-dextroamphetamine (ADDERALL) 20 MG tablet Take 20 mg by mouth daily.    . carvedilol (COREG) 6.25 MG tablet Take 1 tablet (6.25  mg total) by mouth 2 (two) times daily with a meal. 60 tablet 2  . DiphenhydrAMINE HCl (BENADRYL ALLERGY PO) Take 25 mg by mouth. Reported on 08/16/2015    . EPINEPHrine 0.3 mg/0.3 mL IJ SOAJ injection Inject 0.3 mLs (0.3 mg total) into the muscle once. 1 Device 2  . escitalopram (LEXAPRO) 20 MG tablet Take 20 mg by mouth daily.    . hydrochlorothiazide (HYDRODIURIL) 25 MG tablet Take 2 tablets daily (Patient taking differently: Take 25 mg by mouth daily. ) 180 tablet 3  . metFORMIN (GLUCOPHAGE) 500 MG tablet Start with one pill daily and increase to 2 pills daily (together or divided) after 2-3 weeks (Patient taking differently: Take 2 tablets by mouth daily) 180 tablet 3  . montelukast (SINGULAIR) 10 MG tablet Take 1 tablet (10 mg total) by mouth at bedtime. 30 tablet 3  . potassium chloride SA (K-DUR,KLOR-CON) 20 MEQ tablet Take 1 tablet (20 mEq total) by mouth daily. 30 tablet 3   No current facility-administered medications on file prior to visit.     Review of Systems:  Review of Systems  Constitutional: Positive for chills, diaphoresis, fever and malaise/fatigue.  HENT: Positive for congestion and sore throat. Negative for ear discharge, ear pain, hearing loss, nosebleeds and tinnitus.   Respiratory: Positive for cough and shortness of breath.        Positive for chest pain from coughing.  Cardiovascular: Negative for palpitations.  Gastrointestinal: Positive for diarrhea and nausea. Negative for constipation and vomiting.  Genitourinary: Negative for dysuria, frequency and urgency.  Musculoskeletal: Positive for myalgias. Negative for back pain, falls, joint pain and neck pain.  Skin: Negative for itching and rash.  Neurological: Positive for weakness.  All other systems reviewed and are negative.    Physical Examination: Vitals:   08/08/16 1142 08/08/16 1151  BP: (!) 166/88 131/89  Pulse: 65   Temp: 97.6 F (36.4 C)    Vitals:   08/08/16 1142  Weight: 264 lb (119.7 kg)   Height: 5\' 5"  (1.651 m)   Body mass index is 43.93 kg/m. Ideal Body Weight: Weight in (lb) to have BMI = 25: 149.9  Physical Examination: General appearance - alert, well appearing, and in no distress, oriented to person, place, and time, overweight and ill-appearing Mental status - alert, oriented to person, place, and time Eyes - pupils equal and reactive, extraocular eye movements intact Ears - bilateral TM's and external ear canals normal Nose - clear rhinorrhea Mouth -  mucous membranes moist, pharynx normal without lesions Neck - supple, no significant adenopathy Lymphatics - no palpable lymphadenopathy, no hepatosplenomegaly Chest - decreased air movement, increased work of breathing Heart - normal rate, regular rhythm, normal S1, S2, no murmurs, rubs, clicks or gallops Abdomen - soft, nontender, nondistended, no masses or organomegaly Neurological - alert, oriented, normal speech, no focal findings or movement disorder noted Musculoskeletal - no joint tenderness, deformity or swelling Extremities - peripheral pulses normal, no pedal edema, no clubbing or cyanosis Skin - normal coloration and turgor, no rashes, no suspicious skin lesions noted  Given a duoneb- this improved her air movement and she felt better We are out of flu tests but presume this is flu Assessment and Plan: Influenza - Plan: oseltamivir (TAMIFLU) 75 MG capsule  Chest tightness - Plan: ipratropium-albuterol (DUONEB) 0.5-2.5 (3) MG/3ML nebulizer solution 3 mL  Bronchospasm - Plan: predniSONE (DELTASONE) 20 MG tablet  Hypokalemia - Plan: Basic metabolic panel  Here today with typical flu sx not better on abx Will treat with tamiflu for presumed influenza Prednisone for 3-6 days for her chest sx She will let me know if not improving over the next couple of days- Sooner if worse.  Ok to continue using albuterol as needed We will take this opportunity to monitor her potassium    Return to work on  Wednesday (08-14-2016).  Signed Lamar Blinks, MD

## 2016-08-08 NOTE — Progress Notes (Signed)
Pre visit review using our clinic review tool, if applicable. No additional management support is needed unless otherwise documented below in the visit note. 

## 2016-08-08 NOTE — Patient Instructions (Signed)
You likely have the flu Fill and use the tamiflu if not too expensive You can also use a short prednisone taper Gabriela Torres out your zpack Continue inhaler as needed

## 2016-08-13 ENCOUNTER — Encounter: Payer: Self-pay | Admitting: Family Medicine

## 2016-08-14 ENCOUNTER — Encounter: Payer: Self-pay | Admitting: Family Medicine

## 2016-08-14 NOTE — Telephone Encounter (Signed)
Duplicate message. 

## 2016-08-15 ENCOUNTER — Ambulatory Visit (HOSPITAL_BASED_OUTPATIENT_CLINIC_OR_DEPARTMENT_OTHER)
Admission: RE | Admit: 2016-08-15 | Discharge: 2016-08-15 | Disposition: A | Discharge status: Home / Self Care | Payer: BLUE CROSS/BLUE SHIELD | Source: Ambulatory Visit | Attending: Family Medicine | Admitting: Family Medicine

## 2016-08-15 ENCOUNTER — Ambulatory Visit (INDEPENDENT_AMBULATORY_CARE_PROVIDER_SITE_OTHER): Payer: BLUE CROSS/BLUE SHIELD | Admitting: Family Medicine

## 2016-08-15 VITALS — BP 137/91 | HR 86 | Temp 98.6°F | Ht 65.0 in | Wt 264.6 lb

## 2016-08-15 DIAGNOSIS — J9801 Acute bronchospasm: Secondary | ICD-10-CM

## 2016-08-15 DIAGNOSIS — E119 Type 2 diabetes mellitus without complications: Secondary | ICD-10-CM | POA: Diagnosis not present

## 2016-08-15 DIAGNOSIS — R918 Other nonspecific abnormal finding of lung field: Secondary | ICD-10-CM | POA: Insufficient documentation

## 2016-08-15 MED ORDER — PREDNISONE 20 MG PO TABS: ORAL_TABLET | ORAL | 0 refills | Status: DC

## 2016-08-15 MED ORDER — IPRATROPIUM-ALBUTEROL 0.5-2.5 (3) MG/3ML IN SOLN: 3.0000 mL | Freq: Four times a day (QID) | RESPIRATORY_TRACT | 1 refills | Status: DC | PRN

## 2016-08-15 MED ORDER — IPRATROPIUM-ALBUTEROL 0.5-2.5 (3) MG/3ML IN SOLN
3.0000 mL | Freq: Once | RESPIRATORY_TRACT | Status: AC
  Administered 2016-08-15: 3 mL via RESPIRATORY_TRACT

## 2016-08-15 MED ORDER — FLUTICASONE PROPIONATE HFA 110 MCG/ACT IN AERO: 2.0000 | INHALATION_SPRAY | Freq: Two times a day (BID) | RESPIRATORY_TRACT | 1 refills | Status: DC

## 2016-08-15 NOTE — Patient Instructions (Signed)
You sound much better following your second neb treatment today- please pick up a nebulizer machine from [facility] today You can continue to use the neb treatment as needed at home- you will use this instead of the albuterol inhaler for now Please go back on flovent according to the directions for the time being Also use the prednisone as directed  Please let me know if you are not continuing to do better- if you start to get worse again go to the ER!

## 2016-08-15 NOTE — Progress Notes (Signed)
[facility] [facility] [address] [place], [place] [address] [phone]  Date:  08/15/2016   Name:  Gabriela Torres   DOB:  [birth year]   MRN:  [id number]  PCP:  Lamar Blinks, MD    Chief Complaint: Shortness of Breath (Pt still having SOB and feels bronchospasm are worse. Sx's worse at night. Coughing has improved some. Pt will need note for work. )   History of Present Illness:  Gabriela Torres is a 46 y.o. very pleasant female patient who presents with the following:  Patient last seen on 08-08-2016 for presumed influenza (we did not have a flu test to give her so diagnosis was clinica).  We gave her prednisone at last visiit and tamiflu- she did not end up taking the tamiflu due to expense.  However she did get better temporarily with the prednisone-   She finished her prednisone yesterday- did seem to be a bit better while using the prednisone,but sx have gotten worse again over the last day or so.   She notes that she is having a hard time breathing, seems to be getting worse again.  She feels like her "airways are in spasm" She has not noted any fever or other systemic sx at this time  No more body aches, chills, or headaches.  These flu like sx ae resolved  Albuterol does not seem to be helping her at home  She was quite sick with bronchospasm in the fall- otherwise she has NOT been chronically bothered by lung concerns over her life, this is a more recent development She has well controlled DM- see most recent A1c below Lab Results  Component Value Date   HGBA1C 5.9 03/20/2016     We did use flovent last year which helped with her sx- she is out of this and will need a refill  Wrote work note to return to work on 08-14-2016.  Still has not returned to work as of yet- will need a new note today   Patient Active Problem List   Diagnosis Date Noted  . Diabetes mellitus type 2, controlled, without complications ([place])  [id number]  . Overweight 08/19/2015  . Migraine 04/17/2013  . HTN (hypertension) 05/18/2012  . Hyperlipidemia 05/18/2012  . Ventral hernia 09/03/2011    Past Medical History:  Diagnosis Date  . Abdominal pain   . Abdominal wall hernia   . Anxiety   . Hearing loss   . Hyperlipidemia   . Hypertension   . Migraines     Past Surgical History:  Procedure Laterality Date  . ABDOMINAL HYSTERECTOMY  09/2003 or 09/2004    total   . HERNIA REPAIR    . NASAL TURBINATE REDUCTION  2000  . TONSILLECTOMY  2008  . VENTRAL HERNIA REPAIR  11/01/2011   Procedure: LAPAROSCOPIC VENTRAL HERNIA;  Surgeon: Merrie Roof, MD;  Location: [facility];  Service: General;  Laterality: N/A;    Social History  Substance Use Topics  . Smoking status: Former Smoker    Packs/day: 0.25    Years: 1.00    Types: Cigarettes    Quit date: 10/24/1981  . Smokeless tobacco: Never Used     Comment: social drinker  . Alcohol use Yes     Comment: 3 - 4 per week    Family History  Problem Relation Age of Onset  . Cancer Mother     abdominal  . Cancer Father  colon  . Heart disease Sister     chf    Allergies  Allergen Reactions  . Statins Other (See Comments)    Left lower and rear flank pain.  . Lisinopril     Angioedema   . Norvasc [Amlodipine] Other (See Comments)    Gum hyperplasia  . Food Nausea Only    Dill, rye  . Guaifenesin & Derivatives Nausea And Vomiting    Medication list has been reviewed and updated.  Current Outpatient Prescriptions on File Prior to Visit  Medication Sig Dispense Refill  . albuterol (PROVENTIL HFA;VENTOLIN HFA) 108 (90 Base) MCG/ACT inhaler Inhale 2 puffs into the lungs every 6 (six) hours as needed for wheezing or shortness of breath. 1 Inhaler 3  . ALPRAZolam (XANAX) 1 MG tablet Take 1 mg by mouth 3 (three) times daily as needed for anxiety.    Marland Kitchen amphetamine-dextroamphetamine (ADDERALL) 20 MG tablet Take 20 mg by mouth daily.    . carvedilol (COREG) 6.25 MG  tablet Take 1 tablet (6.25 mg total) by mouth 2 (two) times daily with a meal. 60 tablet 2  . DiphenhydrAMINE HCl (BENADRYL ALLERGY PO) Take 25 mg by mouth. Reported on 08/16/2015    . EPINEPHrine 0.3 mg/0.3 mL IJ SOAJ injection Inject 0.3 mLs (0.3 mg total) into the muscle once. 1 Device 2  . escitalopram (LEXAPRO) 20 MG tablet Take 20 mg by mouth daily.    . hydrochlorothiazide (HYDRODIURIL) 25 MG tablet Take 2 tablets daily (Patient taking differently: Take 25 mg by mouth daily. ) 180 tablet 3  . metFORMIN (GLUCOPHAGE) 500 MG tablet Start with one pill daily and increase to 2 pills daily (together or divided) after 2-3 weeks (Patient taking differently: Take 2 tablets by mouth daily) 180 tablet 3  . montelukast (SINGULAIR) 10 MG tablet Take 1 tablet (10 mg total) by mouth at bedtime. 30 tablet 3  . potassium chloride SA (K-DUR,KLOR-CON) 20 MEQ tablet Take 1 tablet (20 mEq total) by mouth daily. 30 tablet 3   No current facility-administered medications on file prior to visit.     Review of Systems:  As per HPI- otherwise negative.   Physical Examination: Vitals:   08/15/16 1124  BP: (!) 137/91  Pulse: 86  Temp: 98.6 F (37 C)   Vitals:   08/15/16 1124  Weight: 264 lb 9.6 oz (120 kg)  Height: 5\' 5"  (1.651 m)   Body mass index is 44.03 kg/m. Ideal Body Weight: Weight in (lb) to have BMI = 25: 149.9  GEN: WDWN, NAD, Non-toxic, A & O x 3, obese HEENT: Atraumatic, Normocephalic. Neck supple. No masses, No LAD.  Bilateral TM wnl, oropharynx normal.  PEERL,EOMI.   Ears and Nose: No external deformity. CV: RRR, No M/G/R. No JVD. No thrill. No extra heart sounds. PULM: on presentation noted to have very little air movement.  Able to speak in sentences but seemed uncomfortable.  No wheezing due to lack of air movement.  Treated with neb right away as below EXTR: No c/c/e NEURO Normal gait.  PSYCH: Normally interactive. Conversant. Not depressed or anxious appearing.  Calm demeanor.    Dg Chest 2 View  Result Date: 08/15/2016 CLINICAL DATA:  Diagnosed with flu, shortness of breath EXAM: CHEST  2 VIEW COMPARISON:  01/04/2016 FINDINGS: There is mild bilateral interstitial thickening. There is no focal parenchymal opacity. There is no pleural effusion or pneumothorax. There is stable cardiomegaly. The osseous structures are unremarkable. IMPRESSION: Mild bilateral interstitial thickening which may  reflect interstitial edema versus interstitial infection. Electronically Signed   By: Kathreen Devoid   On: 08/15/2016 13:04   Called radiology with question about above reading- doc who read film was no longer on shift.  Spoke with another radiologist who felt that given her clinical picture RAD is the most likely cause of the above film findings  Had patient ambulate around clinic- sat stayed at 98%  Given a duoneb treatment here in clinic at 11:45 This did not help much-  Repeated the treatment 20 minutes later which gave significant relief She does not have a neb machine but is willing to go and get one  Following 2nd duoneb noted to have good air movement to the lung bases bilaterally, minimal to no wheezing.  Pt visibly appeared more comfortable, breathing and speaking easily   Checked random glucose - under 125.  Will need to have my CMA result this test Results for orders placed or performed in visit on 08/15/16  POCT glucose (manual entry)  Result Value Ref Range   POC Glucose 94 70 - 99 mg/dl  ]  Assessment and Plan: Bronchospasm - Plan: ipratropium-albuterol (DUONEB) 0.5-2.5 (3) MG/3ML nebulizer solution 3 mL, ipratropium-albuterol (DUONEB) 0.5-2.5 (3) MG/3ML nebulizer solution 3 mL, fluticasone (FLOVENT HFA) 110 MCG/ACT inhaler, ipratropium-albuterol (DUONEB) 0.5-2.5 (3) MG/3ML SOLN, predniSONE (DELTASONE) 20 MG tablet, DG Chest 2 View  Controlled type 2 diabetes mellitus without complication, without long-term current use of insulin (HCC) - Plan: POCT glucose (manual  entry)  Here today with recurrent bronchospasm following a dx of likely flu a week ago Other sx are resolved but she is wheezing quite a bit today Better following duoneb x2 Will also treat with prednisone, inhaled steroid, and she will get a neb machine to continue duoneb at home She is cautioned to seek emergency care if she is getting worse- she agrees to do so  Meds ordered this encounter  Medications  . ipratropium-albuterol (DUONEB) 0.5-2.5 (3) MG/3ML nebulizer solution 3 mL  . ipratropium-albuterol (DUONEB) 0.5-2.5 (3) MG/3ML nebulizer solution 3 mL  . fluticasone (FLOVENT HFA) 110 MCG/ACT inhaler    Sig: Inhale 2 puffs into the lungs 2 (two) times daily.    Dispense:  1 Inhaler    Refill:  1  . ipratropium-albuterol (DUONEB) 0.5-2.5 (3) MG/3ML SOLN    Sig: Take 3 mLs by nebulization every 6 (six) hours as needed.    Dispense:  360 mL    Refill:  1  . predniSONE (DELTASONE) 20 MG tablet    Sig: Take 2 pills a day for 4 days, then 1 pill a day for 4 days, then 1/2 pill a day for 4 days, then 1/2 pill every other day for 2 doses    Dispense:  16 tablet    Refill:  0     Signed Lamar Blinks, MD

## 2016-08-16 ENCOUNTER — Encounter: Payer: Self-pay | Admitting: Family Medicine

## 2016-08-16 LAB — GLUCOSE, POCT (MANUAL RESULT ENTRY): POC GLUCOSE: 94 mg/dL (ref 70–99)

## 2016-09-13 ENCOUNTER — Other Ambulatory Visit: Payer: Self-pay | Admitting: Family Medicine

## 2016-09-13 DIAGNOSIS — E876 Hypokalemia: Secondary | ICD-10-CM

## 2016-09-23 ENCOUNTER — Encounter: Payer: Self-pay | Admitting: Family Medicine

## 2016-12-01 ENCOUNTER — Encounter: Payer: Self-pay | Admitting: Family Medicine

## 2016-12-26 ENCOUNTER — Other Ambulatory Visit: Payer: Self-pay | Admitting: Family Medicine

## 2016-12-26 DIAGNOSIS — I10 Essential (primary) hypertension: Secondary | ICD-10-CM

## 2016-12-27 ENCOUNTER — Other Ambulatory Visit: Payer: Self-pay | Admitting: Emergency Medicine

## 2016-12-27 DIAGNOSIS — I10 Essential (primary) hypertension: Secondary | ICD-10-CM

## 2016-12-27 MED ORDER — CARVEDILOL 6.25 MG PO TABS: 6.2500 mg | ORAL_TABLET | Freq: Two times a day (BID) | ORAL | 1 refills | Status: DC

## 2017-02-06 ENCOUNTER — Other Ambulatory Visit: Payer: Self-pay | Admitting: Family Medicine

## 2017-02-06 DIAGNOSIS — E876 Hypokalemia: Secondary | ICD-10-CM

## 2017-03-05 ENCOUNTER — Encounter: Payer: Self-pay | Admitting: Family Medicine

## 2017-03-12 ENCOUNTER — Ambulatory Visit (INDEPENDENT_AMBULATORY_CARE_PROVIDER_SITE_OTHER): Payer: BLUE CROSS/BLUE SHIELD | Admitting: Family Medicine

## 2017-03-12 ENCOUNTER — Encounter: Payer: Self-pay | Admitting: Family Medicine

## 2017-03-12 ENCOUNTER — Ambulatory Visit: Payer: Self-pay | Admitting: Family Medicine

## 2017-03-12 VITALS — BP 141/90 | HR 62 | Temp 98.5°F | Ht 65.0 in | Wt 248.6 lb

## 2017-03-12 DIAGNOSIS — E119 Type 2 diabetes mellitus without complications: Secondary | ICD-10-CM | POA: Diagnosis not present

## 2017-03-12 DIAGNOSIS — Z13220 Encounter for screening for lipoid disorders: Secondary | ICD-10-CM | POA: Diagnosis not present

## 2017-03-12 DIAGNOSIS — I10 Essential (primary) hypertension: Secondary | ICD-10-CM

## 2017-03-12 DIAGNOSIS — R0789 Other chest pain: Secondary | ICD-10-CM | POA: Diagnosis not present

## 2017-03-12 LAB — TROPONIN I: TNIDX: 0.01 ug/L (ref 0.00–0.06)

## 2017-03-12 MED ORDER — CLONIDINE HCL 0.1 MG/24HR TD PTWK: 0.1000 mg | MEDICATED_PATCH | TRANSDERMAL | 12 refills | Status: DC

## 2017-03-12 NOTE — Progress Notes (Signed)
Pre visit review using our clinic tool,if applicable. No additional management support is needed unless otherwise documented below in the visit note.  

## 2017-03-12 NOTE — Patient Instructions (Addendum)
It was good to see you today!  We will be in touch with your labs asap We will add clonidine patch to your BP regimen- apply one patch weekly Continue your other BP meds at the same dose

## 2017-03-12 NOTE — Progress Notes (Addendum)
[place] [place], [place], Gabriela Torres [id number] [phone]  Date:  03/12/2017   Name:  Gabriela Torres   DOB:  1970/10/19   MRN:  [id number]  PCP:  Darreld Mclean, MD    Chief Complaint: Follow-up (BP  Has had Flu shot for 2018)   History of Present Illness:  Gabriela Torres is a 46 y.o. very pleasant female patient who presents with the following:  Last seen by myself in February of this year History of DM, HTN, hyperlipidemia  She has been concerned about her BP- due to BP being too high to start a new job.  She has to have her BP under a certain level to start a job with [employer].  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  More recent readings are better She is overall feeling well, and has not noted sx of HTN such as HA However she does admit that about 3 days ago she felt a pressure in her chest that lasted all day. It resolved when she took an aspirin.  This has not re-curred since She does not have any exertional CP No history of CAD is known  She smoked lightly in college only  She is taking potassium 20 daily for her chronic hypokalemia Flu shot done already Lab Results  Component Value Date   HGBA1C 5.9 03/20/2016   BP Readings from Last 3 Encounters:  03/12/17 (!) 133/46  08/15/16 (!) 137/91  08/08/16 131/89     Patient Active Problem List   Diagnosis Date Noted  . Diabetes mellitus type 2, controlled, without complications ([place]) 76/28/3151  . Overweight 08/19/2015  . Migraine 04/17/2013  . HTN (hypertension) 05/18/2012  . Hyperlipidemia 05/18/2012  . Ventral hernia 09/03/2011    Past Medical History:  Diagnosis Date  . Abdominal  pain   . Abdominal wall hernia   . Anxiety   . Hearing loss   . Hyperlipidemia   . Hypertension   . Migraines     Past Surgical History:  Procedure Laterality Date  . ABDOMINAL HYSTERECTOMY  09/2003 or 09/2004    total   . HERNIA REPAIR    . NASAL TURBINATE REDUCTION  2000  . TONSILLECTOMY  2008  . VENTRAL HERNIA REPAIR  11/01/2011   Procedure: LAPAROSCOPIC VENTRAL HERNIA;  Surgeon: Merrie Roof, MD;  Location: [facility];  Service: General;  Laterality: N/A;    Social History  Substance Use Topics  . Smoking status: Former Smoker    Packs/day: 0.25    Years: 1.00    Types: Cigarettes    Quit date: 10/24/1981  . Smokeless tobacco: Never Used     Comment: social drinker  . Alcohol use Yes     Comment: 3 - 4 per week    Family History  Problem Relation Age of Onset  . Cancer Mother        abdominal  . Cancer Father        colon  . Heart disease Sister        chf    Allergies  Allergen Reactions  . Statins Other (See Comments)  Left lower and rear flank pain.  . Lisinopril     Angioedema   . Norvasc [Amlodipine] Other (See Comments)    Gum hyperplasia  . Food Nausea Only    Dill, rye  . Guaifenesin & Derivatives Nausea And Vomiting    Medication list has been reviewed and updated.  Current Outpatient Prescriptions on File Prior to Visit  Medication Sig Dispense Refill  . albuterol (PROVENTIL HFA;VENTOLIN HFA) 108 (90 Base) MCG/ACT inhaler Inhale 2 puffs into the lungs every 6 (six) hours as needed for wheezing or shortness of breath. 1 Inhaler 3  . ALPRAZolam (XANAX) 1 MG tablet Take 1 mg by mouth 3 (three) times daily as needed for anxiety.    Marland Kitchen amphetamine-dextroamphetamine (ADDERALL) 20 MG tablet Take 20 mg by mouth daily.    . carvedilol (COREG) 6.25 MG tablet Take 1 tablet (6.25 mg total) by mouth 2 (two) times daily with a meal. 180 tablet 1  . DiphenhydrAMINE HCl (BENADRYL ALLERGY PO) Take 25 mg by mouth. Reported on 08/16/2015    . EPINEPHrine 0.3  mg/0.3 mL IJ SOAJ injection Inject 0.3 mLs (0.3 mg total) into the muscle once. 1 Device 2  . escitalopram (LEXAPRO) 20 MG tablet Take 20 mg by mouth daily.    . fluticasone (FLOVENT HFA) 110 MCG/ACT inhaler Inhale 2 puffs into the lungs 2 (two) times daily. 1 Inhaler 1  . hydrochlorothiazide (HYDRODIURIL) 25 MG tablet Take 2 tablets daily (Patient taking differently: Take 25 mg by mouth daily. ) 180 tablet 3  . ipratropium-albuterol (DUONEB) 0.5-2.5 (3) MG/3ML SOLN Take 3 mLs by nebulization every 6 (six) hours as needed. 360 mL 1  . KLOR-CON M20 20 MEQ tablet TAKE 1 TABLET BY MOUTH ONCE DAILY 30 tablet 3  . metFORMIN (GLUCOPHAGE) 500 MG tablet Start with one pill daily and increase to 2 pills daily (together or divided) after 2-3 weeks (Patient taking differently: Take 2 tablets by mouth daily) 180 tablet 3  . montelukast (SINGULAIR) 10 MG tablet Take 1 tablet (10 mg total) by mouth at bedtime. 30 tablet 3  . predniSONE (DELTASONE) 20 MG tablet Take 2 pills a day for 4 days, then 1 pill a day for 4 days, then 1/2 pill a day for 4 days, then 1/2 pill every other day for 2 doses 16 tablet 0   No current facility-administered medications on file prior to visit.     Review of Systems:  As per HPI- otherwise negative.   Physical Examination: Vitals:   03/12/17 1415 03/12/17 1418  BP: (!) 140/91 (!) 133/46  Pulse: 62   Temp: 98.5 F (36.9 C)   SpO2: 97%    Vitals:   03/12/17 1415  Weight: 248 lb 9.6 oz (112.8 kg)  Height: 5\' 5"  (1.651 m)   Body mass index is 41.37 kg/m. Ideal Body Weight: Weight in (lb) to have BMI = 25: 149.9  GEN: WDWN, NAD, Non-toxic, A & O x 3, overweight, looks well HEENT: Atraumatic, Normocephalic. Neck supple. No masses, No LAD. Ears and Nose: No external deformity. CV: RRR, No M/G/R. No JVD. No thrill. No extra heart sounds. PULM: CTA B, no wheezes, crackles, rhonchi. No retractions. No resp. distress. No accessory muscle use. ABD: S, NT, ND, +BS. No  rebound. No HSM. EXTR: No c/c/e NEURO Normal gait.  PSYCH: Normally interactive. Conversant. Not depressed or anxious appearing.  Calm demeanor.   EKG: compared with tracing from 2017 No significant change.  She does have early repol  ST changes but nothing acutely different  Assessment and Plan: Essential hypertension - Plan: cloNIDine (CATAPRES - DOSED IN MG/24 HR) 0.1 mg/24hr patch  Controlled type 2 diabetes mellitus without complication, without long-term current use of insulin (HCC) - Plan: Comprehensive metabolic panel, Hemoglobin A1c, Microalbumin / creatinine urine ratio  Chest tightness - Plan: EKG 12-Lead, Troponin I  Screening for hyperlipidemia - Plan: Lipid panel  Here today with a couple of concerns.  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  She is already on HCTZ and a BB, and her pulse will not allow increase of BB. She cannot tolerate ACE due to angioedema, and amlodipine caused gum hyperplasia, so our options are somewhat limited.  Decided to use a low dose clonidine patch for now.  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  Will obtain a troponin- assuming negative will consider a treadmill per pt preference, and definitely if sx come back  Follow-up on other labs as above  Results for orders placed or performed in visit on 03/12/17  Troponin I  Result Value Ref Range   TNIDX 0.01 0.00 - 0.06 ug/l     Signed Lamar Blinks, MD  Received the rest of her labs 9/7 Message to pt  Results for orders placed or performed in visit on 03/12/17  Comprehensive metabolic panel  Result Value Ref Range   Sodium 137 135 - 145 mEq/L   Potassium 3.5 3.5 - 5.1 mEq/L   Chloride 99 96 - 112  mEq/L   CO2 28 19 - 32 mEq/L   Glucose, Bld 75 70 - 99 mg/dL   BUN 14 6 - 23 mg/dL   Creatinine, Ser 0.69 0.40 - 1.20 mg/dL   Total Bilirubin 0.4 0.2 - 1.2 mg/dL   Alkaline Phosphatase 67 39 - 117 U/L   AST 15 0 - 37 U/L   ALT 11 0 - 35 U/L   Total Protein 7.5 6.0 - 8.3 g/dL   Albumin 4.1 3.5 - 5.2 g/dL   Calcium 9.7 8.4 - 10.5 mg/dL   GFR 117.03 >60.00 mL/min  Hemoglobin A1c  Result Value Ref Range   Hgb A1c MFr Bld 6.0 4.6 - 6.5 %  Microalbumin / creatinine urine ratio  Result Value Ref Range   Microalb, Ur <0.7 0.0 - 1.9 mg/dL   Creatinine,U 196.3 mg/dL   Microalb Creat Ratio 0.4 0.0 - 30.0 mg/g  Lipid panel  Result Value Ref Range   Cholesterol 245 (H) 0 - 200 mg/dL   Triglycerides 99.0 0.0 - 149.0 mg/dL   HDL 37.30 (L) >39.00 mg/dL   VLDL 19.8 0.0 - 40.0 mg/dL   LDL Cholesterol 188 (H) 0 - 99 mg/dL   Total CHOL/HDL Ratio 7    NonHDL 208.11   Troponin I  Result Value Ref Range   TNIDX 0.01 0.00 - 0.06 ug/l

## 2017-03-13 LAB — COMPREHENSIVE METABOLIC PANEL
ALT: 11 U/L (ref 0–35)
AST: 15 U/L (ref 0–37)
Albumin: 4.1 g/dL (ref 3.5–5.2)
Alkaline Phosphatase: 67 U/L (ref 39–117)
BUN: 14 mg/dL (ref 6–23)
CALCIUM: 9.7 mg/dL (ref 8.4–10.5)
CHLORIDE: 99 meq/L (ref 96–112)
CO2: 28 meq/L (ref 19–32)
Creatinine, Ser: 0.69 mg/dL (ref 0.40–1.20)
GFR: 117.03 mL/min (ref >60.00)
Glucose, Bld: 75 mg/dL (ref 70–99)
POTASSIUM: 3.5 meq/L (ref 3.5–5.1)
Sodium: 137 mEq/L (ref 135–145)
Total Bilirubin: 0.4 mg/dL (ref 0.2–1.2)
Total Protein: 7.5 g/dL (ref 6.0–8.3)

## 2017-03-13 LAB — LIPID PANEL
Cholesterol: 245 mg/dL — ABNORMAL HIGH (ref 0–200)
HDL: 37.3 mg/dL — AB (ref >39.00)
LDL Cholesterol: 188 mg/dL — ABNORMAL HIGH (ref 0–99)
NonHDL: 208.11
Total CHOL/HDL Ratio: 7
Triglycerides: 99 mg/dL (ref 0.0–149.0)
VLDL: 19.8 mg/dL (ref 0.0–40.0)

## 2017-03-13 LAB — MICROALBUMIN / CREATININE URINE RATIO
Creatinine,U: 196.3 mg/dL
MICROALB/CREAT RATIO: 0.4 mg/g (ref 0.0–30.0)
Microalb, Ur: <0.7 mg/dL (ref 0.0–1.9)

## 2017-03-13 LAB — HEMOGLOBIN A1C: Hgb A1c MFr Bld: 6 % (ref 4.6–6.5)

## 2017-03-13 NOTE — Telephone Encounter (Signed)
Called health at work and spoke with Benjamine Mola  BP Readings from Last 3 Encounters:  03/12/17 (!) 141/90  08/15/16 (!) 137/91  08/08/16 131/89

## 2017-03-14 ENCOUNTER — Encounter: Payer: Self-pay | Admitting: Family Medicine

## 2017-03-14 DIAGNOSIS — R0789 Other chest pain: Secondary | ICD-10-CM

## 2017-03-15 ENCOUNTER — Encounter: Payer: Self-pay | Admitting: Family Medicine

## 2017-03-17 ENCOUNTER — Encounter: Payer: Self-pay | Admitting: Family Medicine

## 2017-03-18 ENCOUNTER — Encounter: Payer: Self-pay | Admitting: Family Medicine

## 2017-04-15 ENCOUNTER — Encounter: Payer: Self-pay | Admitting: Family Medicine

## 2017-04-15 DIAGNOSIS — I10 Essential (primary) hypertension: Secondary | ICD-10-CM

## 2017-04-15 DIAGNOSIS — E119 Type 2 diabetes mellitus without complications: Secondary | ICD-10-CM

## 2017-04-15 MED ORDER — HYDROCHLOROTHIAZIDE 25 MG PO TABS: 25.0000 mg | ORAL_TABLET | Freq: Every day | ORAL | 1 refills | Status: DC

## 2017-04-15 MED ORDER — METFORMIN HCL 500 MG PO TABS: 1000.0000 mg | ORAL_TABLET | Freq: Every day | ORAL | 1 refills | Status: DC

## 2017-04-15 MED ORDER — CARVEDILOL 6.25 MG PO TABS: 6.2500 mg | ORAL_TABLET | Freq: Two times a day (BID) | ORAL | 1 refills | Status: DC

## 2017-04-23 ENCOUNTER — Encounter: Payer: Self-pay | Admitting: Family Medicine

## 2017-04-23 DIAGNOSIS — I10 Essential (primary) hypertension: Secondary | ICD-10-CM

## 2017-04-24 MED ORDER — CLONIDINE 0.1 MG/24HR TD PTWK
0.1000 mg | MEDICATED_PATCH | TRANSDERMAL | 3 refills | Status: DC
Start: 2017-04-24 — End: 2018-10-07

## 2017-04-27 ENCOUNTER — Encounter: Payer: Self-pay | Admitting: Family Medicine

## 2017-04-27 DIAGNOSIS — M79673 Pain in unspecified foot: Secondary | ICD-10-CM

## 2017-06-18 ENCOUNTER — Other Ambulatory Visit: Payer: Self-pay | Admitting: Family Medicine

## 2017-06-18 DIAGNOSIS — E876 Hypokalemia: Secondary | ICD-10-CM

## 2017-08-25 ENCOUNTER — Other Ambulatory Visit: Payer: Self-pay | Admitting: Family Medicine

## 2017-08-25 DIAGNOSIS — E876 Hypokalemia: Secondary | ICD-10-CM

## 2017-09-08 MED FILL — AMPHETAMINE SALTS 30 MG TAB: 30 | 30 days supply | Qty: 60 | Fill #0

## 2017-09-08 MED FILL — ALPRAZolam 1 MG TABS: 1 | 30 days supply | Qty: 90 | Fill #0 | Status: TO

## 2017-09-10 MED FILL — metFORMIN HCL 500 MG TABS: 500 | 90 days supply | Qty: 180 | Fill #0

## 2017-09-10 MED FILL — ESCITALOPRAM 20 MG TABLET: 20 | 30 days supply | Qty: 45 | Fill #0

## 2017-09-10 MED FILL — cloNIDine 0.1 MG/24HR PTWK: 0.1 | 28 days supply | Qty: 4 | Fill #0

## 2017-09-10 MED FILL — CARVEDILOL 6.25 MG TAB: 6.25 | 90 days supply | Qty: 180 | Fill #0

## 2017-09-10 MED FILL — HYDROCHLOROTHIAZIDE 25 MG T: 25 | 90 days supply | Qty: 90 | Fill #0

## 2017-09-23 MED FILL — POTASSIUM CL ER 20 MEQ TAB: 20 | 30 days supply | Qty: 30 | Fill #0

## 2017-11-05 ENCOUNTER — Other Ambulatory Visit: Payer: Self-pay | Admitting: Family Medicine

## 2017-11-05 DIAGNOSIS — E876 Hypokalemia: Secondary | ICD-10-CM

## 2017-11-05 DIAGNOSIS — I10 Essential (primary) hypertension: Secondary | ICD-10-CM

## 2018-01-06 IMAGING — DX DG CHEST 2V
2 series · 2 of 2 positions shown · non-contrast
Comparison: May 18, 2012

CLINICAL DATA: Back pain and cough.

EXAM:
CHEST  2 VIEW

[chest pa]
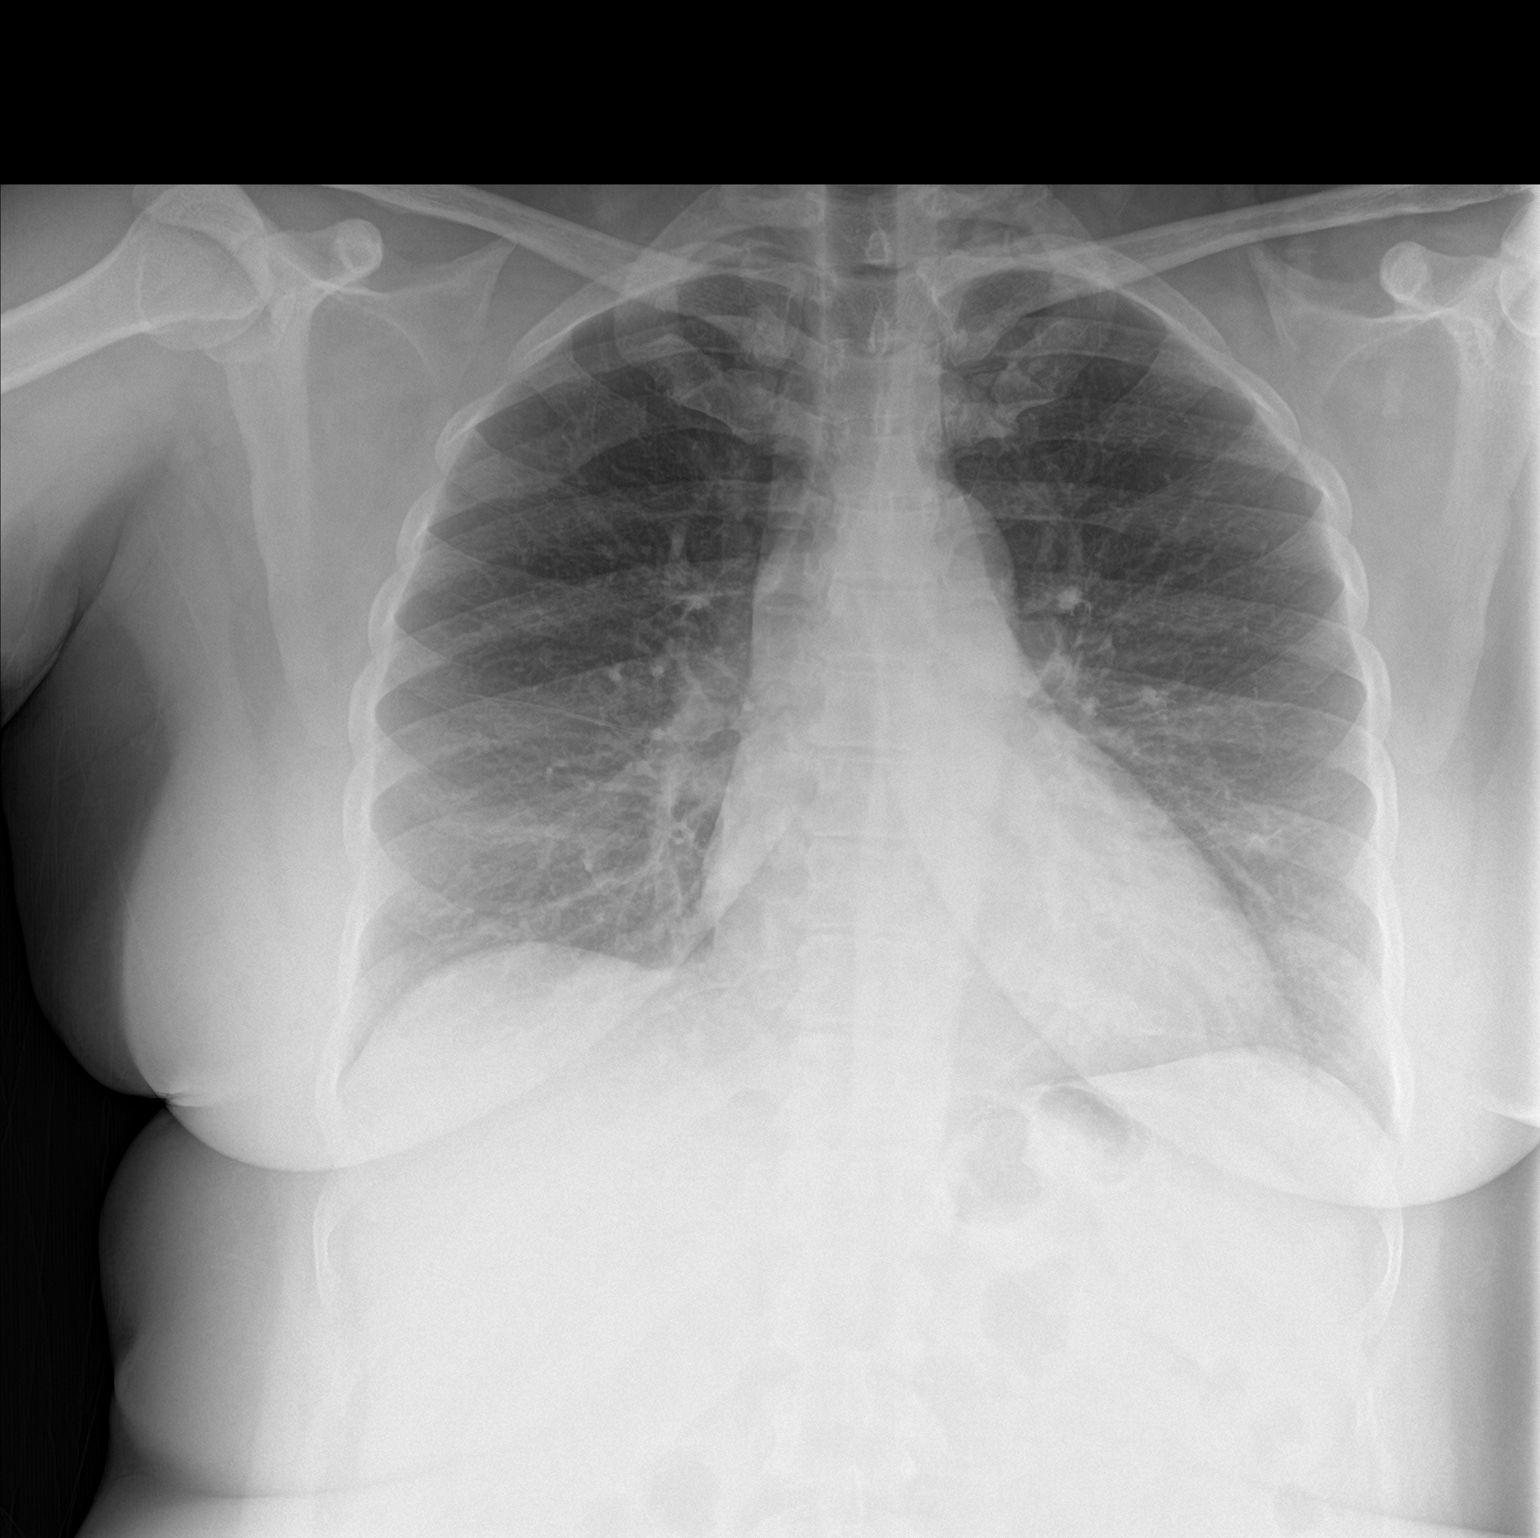

[chest lat]
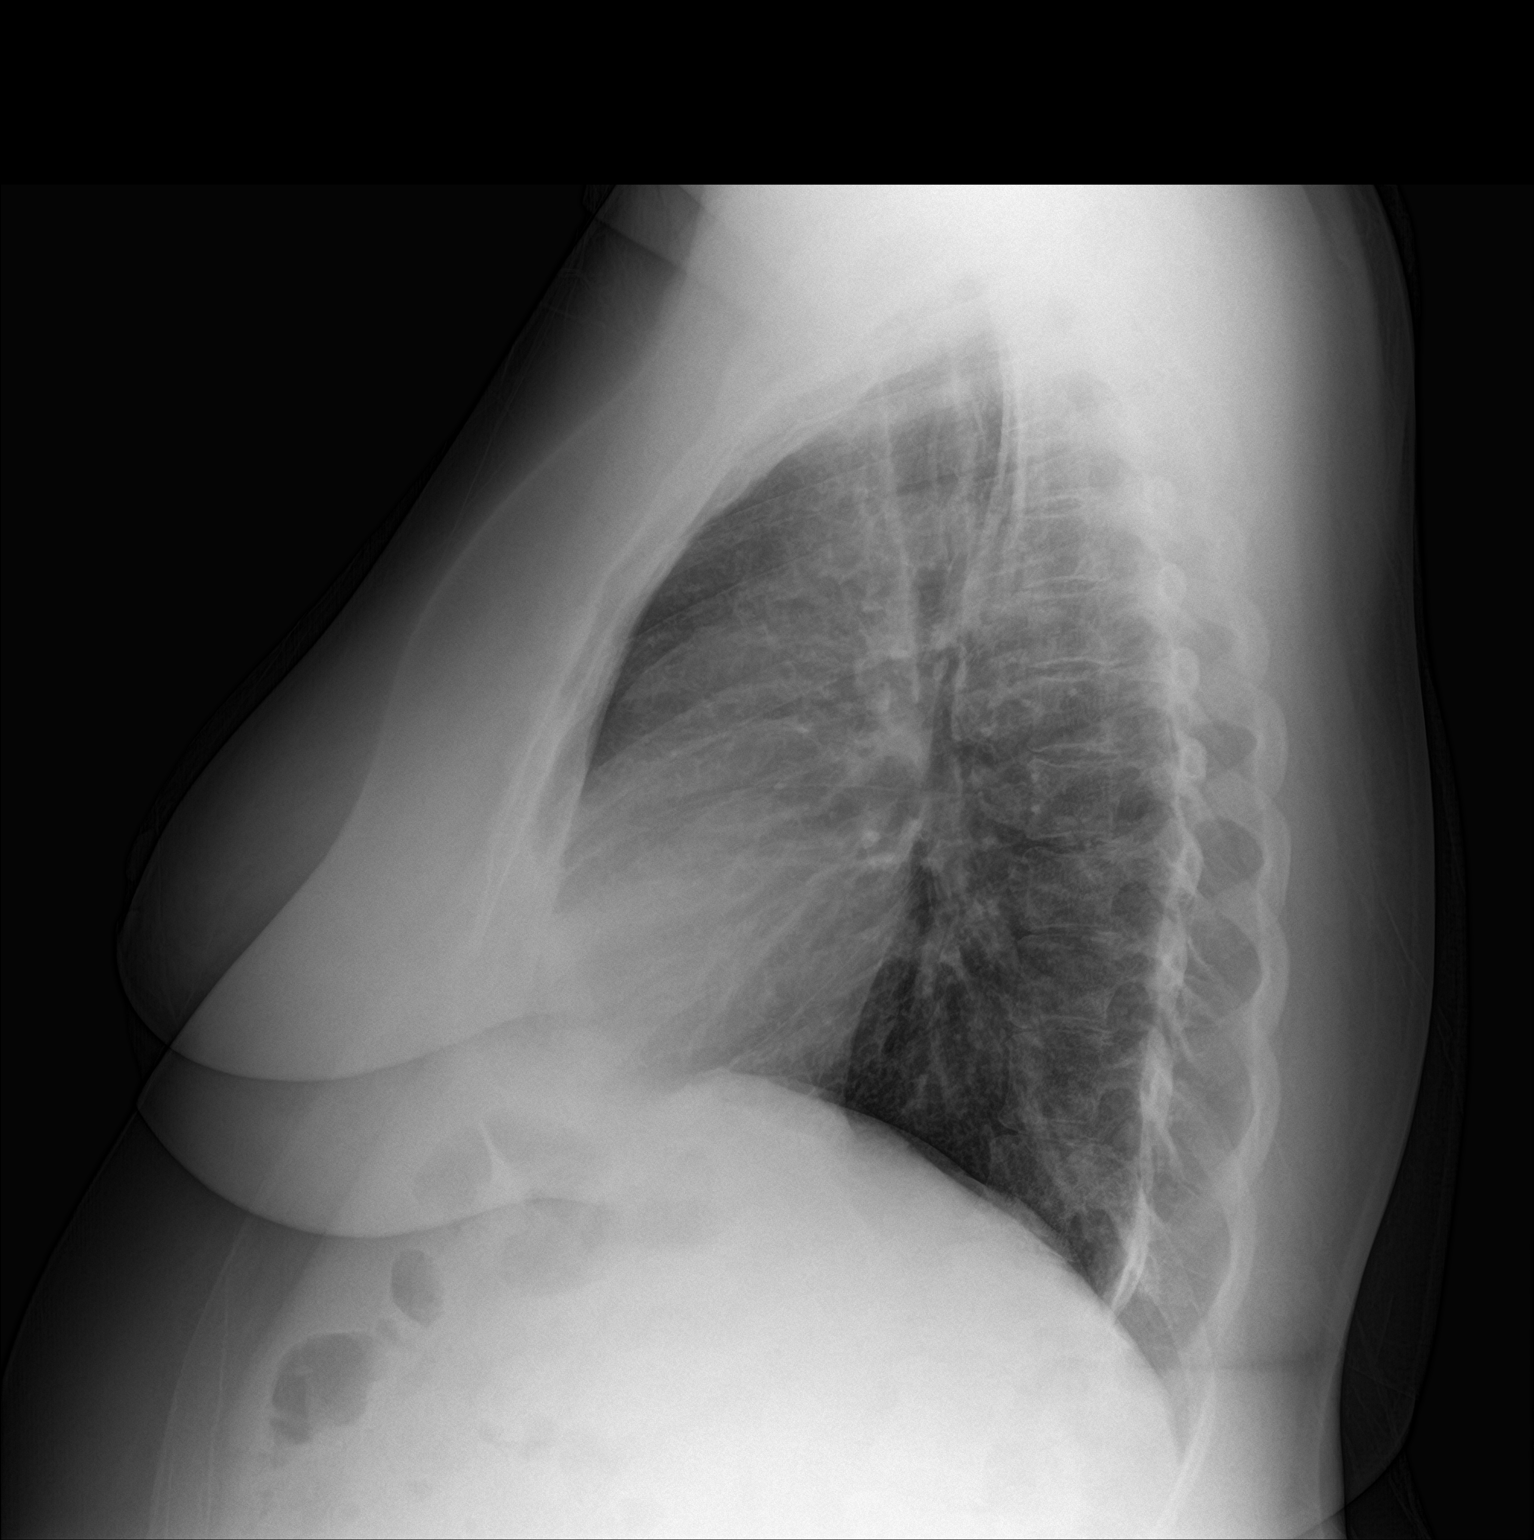

[2 of 2 positions shown; findings below may reference images not displayed]

FINDINGS: The heart, hila, mediastinum, lungs, and pleura are normal. Splaying
of the posterior right fourth and fifth ribs is unchanged, possibly
sequela of remote trauma.
IMPRESSION: No active cardiopulmonary disease.

## 2018-02-06 ENCOUNTER — Telehealth: Payer: Self-pay

## 2018-02-06 ENCOUNTER — Other Ambulatory Visit: Payer: Self-pay | Admitting: Family Medicine

## 2018-02-06 DIAGNOSIS — I10 Essential (primary) hypertension: Secondary | ICD-10-CM

## 2018-02-06 NOTE — Telephone Encounter (Signed)
Pt. Requesting HCTZ refill, last OV 03/2017. Routed to Dr. Lorelei Pont to advise.

## 2018-02-07 ENCOUNTER — Encounter: Payer: Self-pay | Admitting: Family Medicine

## 2018-02-07 MED ORDER — HYDROCHLOROTHIAZIDE 25 MG PO TABS: 25.0000 mg | ORAL_TABLET | Freq: Every day | ORAL | 0 refills | Status: DC

## 2018-02-09 ENCOUNTER — Encounter: Payer: Self-pay | Admitting: Family Medicine

## 2018-02-11 ENCOUNTER — Encounter: Payer: Self-pay | Admitting: Family Medicine

## 2018-02-12 ENCOUNTER — Encounter: Payer: Self-pay | Admitting: Family Medicine

## 2018-02-12 ENCOUNTER — Ambulatory Visit (INDEPENDENT_AMBULATORY_CARE_PROVIDER_SITE_OTHER): Payer: Self-pay | Admitting: Family Medicine

## 2018-02-12 VITALS — BP 126/90 | HR 63 | Temp 98.1°F | Resp 16 | Ht 65.0 in | Wt 237.0 lb

## 2018-02-12 DIAGNOSIS — R590 Localized enlarged lymph nodes: Secondary | ICD-10-CM

## 2018-02-12 DIAGNOSIS — E119 Type 2 diabetes mellitus without complications: Secondary | ICD-10-CM

## 2018-02-12 MED ORDER — PENICILLIN V POTASSIUM 500 MG PO TABS: 500.0000 mg | ORAL_TABLET | Freq: Three times a day (TID) | ORAL | 0 refills | Status: DC

## 2018-02-12 NOTE — Patient Instructions (Signed)
It was good to see you today- let's use a course of penicillin for 10 days.  However, please let me know if you are not seeing improvement in the next few days- Sooner if worse.  We will check an A1c for you today

## 2018-02-12 NOTE — Telephone Encounter (Signed)
Let's offer her a spot between 3 and 4 pm please

## 2018-02-12 NOTE — Progress Notes (Addendum)
[facility] at [facility] [place], [place], [place] [address] [phone] [id number]  Date:  02/12/2018   Name:  Gabriela Torres   DOB:  07/09/1970   MRN:  [id number]  PCP:  Darreld Mclean, MD    Chief Complaint: Adenopathy (swollen lymph nodes in neck, 2 weeks, worsening, very painful, ear and jaw pain, headache)   History of Present Illness:  Gabriela Torres is a 47 y.o. very pleasant female patient who presents with the following:  Last seen here about one year ago for her HTN, DM   She has noted painful swelling in her right cervical nodes and pain with swallowing Right sided sx for 2- 2.5 weeks Getting worse Pain radiates into her right ear and jaw She feels like the left is starting to hurt now as well No fever noted She has not noted any particular tooth pain The pain is keeping her up She is coughing some, and sneezing, and has a lot of nasal mucus  - however the sore nodes are the main concern  Hoarse voice She has been using advil prn   She is working a new job that is PRN only- she does not have insurance as of yet  She is taking her metformin just intermittently right now  She is not checking her glucose   Lab Results  Component Value Date   HGBA1C 6.0 03/12/2017     Patient Active Problem List   Diagnosis Date Noted  . Diabetes mellitus type 2, controlled, without complications ([facility]) 44/96/7591  . Overweight 08/19/2015  . Migraine 04/17/2013  . HTN (hypertension) 05/18/2012  . Hyperlipidemia 05/18/2012  . Ventral hernia 09/03/2011    Past Medical History:  Diagnosis Date  . Abdominal pain   . Abdominal wall hernia   . Anxiety   . Hearing loss   . Hyperlipidemia   . Hypertension   . Migraines     Past Surgical History:  Procedure Laterality Date  . ABDOMINAL HYSTERECTOMY  09/2003 or 09/2004    total   . HERNIA REPAIR    . NASAL TURBINATE REDUCTION  2000  . TONSILLECTOMY  2008  . VENTRAL  HERNIA REPAIR  11/01/2011   Procedure: LAPAROSCOPIC VENTRAL HERNIA;  Surgeon: Merrie Roof, MD;  Location: [facility];  Service: General;  Laterality: N/A;    Social History   Tobacco Use  . Smoking status: Former Smoker    Packs/day: 0.25    Years: 1.00    Pack years: 0.25    Types: Cigarettes    Last attempt to quit: 10/24/1981    Years since quitting: 36.3  . Smokeless tobacco: Never Used  . Tobacco comment: social drinker  Substance Use Topics  . Alcohol use: Yes    Comment: 3 - 4 per week  . Drug use: No    Family History  Problem Relation Age of Onset  . Cancer Mother        abdominal  . Cancer Father        colon  . Heart disease Sister        chf    Allergies  Allergen Reactions  . Statins Other (See Comments)    Left lower and rear flank pain.  . Lisinopril     Angioedema   . Norvasc [Amlodipine] Other (See Comments)    Gum hyperplasia  . Food Nausea Only    Dill, rye  . Guaifenesin &  Derivatives Nausea And Vomiting    Medication list has been reviewed and updated.  Current Outpatient Medications on File Prior to Visit  Medication Sig Dispense Refill  . albuterol (PROVENTIL HFA;VENTOLIN HFA) 108 (90 Base) MCG/ACT inhaler Inhale 2 puffs into the lungs every 6 (six) hours as needed for wheezing or shortness of breath. 1 Inhaler 3  . ALPRAZolam (XANAX) 1 MG tablet Take 1 mg by mouth 3 (three) times daily as needed for anxiety.    Marland Kitchen amphetamine-dextroamphetamine (ADDERALL) 20 MG tablet Take 20 mg by mouth daily.    . carvedilol (COREG) 6.25 MG tablet TAKE 1 TABLET BY MOUTH TWICE DAILY 180 tablet 0  . cloNIDine (CATAPRES - DOSED IN MG/24 HR) 0.1 mg/24hr patch Place 1 patch (0.1 mg total) onto the skin once a week. 4 patch 12  . cloNIDine (CATAPRES - DOSED IN MG/24 HR) 0.1 mg/24hr patch Place 1 patch (0.1 mg total) onto the skin once a week. 12 patch 3  . DiphenhydrAMINE HCl (BENADRYL ALLERGY PO) Take 25 mg by mouth. Reported on 08/16/2015    . EPINEPHrine 0.3  mg/0.3 mL IJ SOAJ injection Inject 0.3 mLs (0.3 mg total) into the muscle once. 1 Device 2  . escitalopram (LEXAPRO) 20 MG tablet Take 20 mg by mouth daily.    . fluticasone (FLOVENT HFA) 110 MCG/ACT inhaler Inhale 2 puffs into the lungs 2 (two) times daily. 1 Inhaler 1  . hydrochlorothiazide (HYDRODIURIL) 25 MG tablet Take 1 tablet (25 mg total) by mouth daily. 90 tablet 0  . ipratropium-albuterol (DUONEB) 0.5-2.5 (3) MG/3ML SOLN Take 3 mLs by nebulization every 6 (six) hours as needed. 360 mL 1  . metFORMIN (GLUCOPHAGE) 500 MG tablet Take 2 tablets (1,000 mg total) by mouth daily with breakfast. 180 tablet 1  . montelukast (SINGULAIR) 10 MG tablet Take 1 tablet (10 mg total) by mouth at bedtime. 30 tablet 3  . potassium chloride SA (K-DUR,KLOR-CON) 20 MEQ tablet TAKE 1 TABLET BY MOUTH DAILY 90 tablet 0   No current facility-administered medications on file prior to visit.     Review of Systems:  As per HPI- otherwise negative. No fever noted No abd pain No CP or SOB  Physical Examination: Vitals:   02/12/18 1527  BP: 126/90  Pulse: 63  Resp: 16  Temp: 98.1 F (36.7 C)  SpO2: 99%   Vitals:   02/12/18 1527  Weight: 237 lb (107.5 kg)  Height: 5\' 5"  (1.651 m)   Body mass index is 39.44 kg/m. Ideal Body Weight: Weight in (lb) to have BMI = 25: 149.9  GEN: WDWN, NAD, Non-toxic, A & O x 3, obese, otherwise looks well  HEENT: Atraumatic, Normocephalic. Neck supple. No masses Bilateral TM wnl, oropharynx normal.  PEERL,EOMI.   Note tender left anterior cervical lymphadenopathy, but nodes are small  Teeth are in ok repair and not tender Throat appears normal No concerningly enlarged or pathological seeming nodes Ears and Nose: No external deformity. CV: RRR, No M/G/R. No JVD. No thrill. No extra heart sounds. PULM: CTA B, no wheezes, crackles, rhonchi. No retractions. No resp. distress. No accessory muscle use. EXTR: No c/c/e NEURO Normal gait.  PSYCH: Normally interactive.  Conversant. Not depressed or anxious appearing.  Calm demeanor.    Assessment and Plan: Controlled type 2 diabetes mellitus without complication, without long-term current use of insulin ([facility]) - Plan: Hemoglobin A1c  Cervical lymphadenopathy - Plan: penicillin v potassium (VEETID) 500 MG tablet  Here today with painful cervical LAD  for 2+ weeks penicillin rx today- she will let me know if not helpful She is well overdue to glucose monitoring- will check an A1c Defer other services today as she is self pay at this time   Signed Lamar Blinks, MD  Received her labs 8/9 Results for orders placed or performed in visit on 02/12/18  Hemoglobin A1c  Result Value Ref Range   Hgb A1c MFr Bld 6.0 4.6 - 6.5 %   Message to pt

## 2018-02-13 ENCOUNTER — Encounter: Payer: Self-pay | Admitting: Family Medicine

## 2018-02-13 DIAGNOSIS — E876 Hypokalemia: Secondary | ICD-10-CM

## 2018-02-13 LAB — HEMOGLOBIN A1C: Hgb A1c MFr Bld: 6 % (ref 4.6–6.5)

## 2018-02-13 MED ORDER — POTASSIUM CHLORIDE CRYS ER 20 MEQ PO TBCR: 20.0000 meq | EXTENDED_RELEASE_TABLET | Freq: Every day | ORAL | 1 refills | Status: DC

## 2018-02-19 ENCOUNTER — Encounter: Payer: Self-pay | Admitting: Family Medicine

## 2018-02-19 MED ORDER — FLUCONAZOLE 150 MG PO TABS: 150.0000 mg | ORAL_TABLET | Freq: Once | ORAL | 0 refills | Status: AC

## 2018-02-26 ENCOUNTER — Encounter: Payer: Self-pay | Admitting: Family Medicine

## 2018-04-28 ENCOUNTER — Encounter: Payer: Self-pay | Admitting: Family Medicine

## 2018-06-28 ENCOUNTER — Other Ambulatory Visit: Payer: Self-pay | Admitting: Family Medicine

## 2018-06-28 DIAGNOSIS — I10 Essential (primary) hypertension: Secondary | ICD-10-CM

## 2018-07-21 NOTE — Progress Notes (Addendum)
[facility] at [facility] [address], [place], [place] [address] [phone] [id number]  Date:  07/23/2018   Name:  Gabriela Torres   DOB:  1971/03/27   MRN:  [id number]  PCP:  Darreld Mclean, MD    Chief Complaint: Private Concerns and URI (congestion, feeling better, coughing-productive cough)   History of Present Illness:  Gabriela Torres is a 48 y.o. very pleasant female patient who presents with the following:  History of hypertension, well controlled diabetes, hyperlipidemia, overweight Last seen here in August, at which point her A1c looked fine as below.  However at that time she had concern of cervical lymphadenopathy.  We tried a course of penicillin, and I asked her to alert me if not better Lab Results  Component Value Date   HGBA1C 6.0 02/12/2018   Foot exam is due- done today  Flu shot:  Eye exam: Pap: s/p pap   Xanax Coreg Clonidine lexapro flovent- not using  hctz duoneb Metformin singulair- not using needs a refill  She is taking potassium daily   She is getting over a cold; she has been sick for a week now but feels that she is on the upswing. Over the last couple of years And has had some issues with bronchospasm.  She did not have asthma as a child, and has never been formally diagnosed with asthma. She did have a fever with this current illness, now resolved  [name] has something to discuss with me today, which is very embarrassing to her  She states that back in 2015 she had a DWI; no one was hurt, she is really embarrassed about this.  She went to court in 06/2107; she was given an order to get a "blow n go" breathalyzer lock on her car.  She has gone to a facility to have the device installed, but she first has to prove that she can use it.  This is prevent her from becoming stranded if she cannot start her car.  However she has not been able to breathe hard enough to trigger the device.   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  There were several branches at this business, and they were all shut down today.  This is obviously upsetting for her, but she hopes she will be to find a new job soon  She needs a few refills today    Patient Active Problem List   Diagnosis Date Noted  . Diabetes mellitus type 2, controlled, without complications ([facility]) 83/66/2947  . Overweight 08/19/2015  . Migraine 04/17/2013  . HTN (hypertension) 05/18/2012  . Hyperlipidemia 05/18/2012  . Ventral hernia 09/03/2011    Past Medical History:  Diagnosis Date  . Abdominal pain   . Abdominal wall hernia   . Anxiety   . Hearing loss   . Hyperlipidemia   . Hypertension   . Migraines     Past Surgical History:  Procedure Laterality Date  . ABDOMINAL HYSTERECTOMY  09/2003 or 09/2004    total   . HERNIA REPAIR    . NASAL TURBINATE REDUCTION  2000  . TONSILLECTOMY  2008  . VENTRAL HERNIA REPAIR  11/01/2011   Procedure: LAPAROSCOPIC VENTRAL HERNIA;  Surgeon: Merrie Roof, MD;  Location: [facility];  Service: General;  Laterality: N/A;    Social History   Tobacco Use  . Smoking status: Former Smoker    Packs/day: 0.25    Years: 1.00    Pack years: 0.25    Types: Cigarettes    Last attempt to quit: 10/24/1981    Years since quitting: 36.7  . Smokeless tobacco: Never Used  . Tobacco comment: social drinker  Substance Use Topics  . Alcohol use: Yes    Comment: 3 - 4 per week  . Drug use: No    Family History  Problem Relation Age of  Onset  . Cancer Mother        abdominal  . Cancer Father        colon  . Heart disease Sister        chf    Allergies  Allergen Reactions  . Statins Other (See Comments)    Left lower and rear flank pain.  . Lisinopril     Angioedema   . Norvasc [Amlodipine] Other (See Comments)    Gum hyperplasia  . Food Nausea Only    Dill, rye  . Guaifenesin & Derivatives Nausea And Vomiting    Medication list has been reviewed and updated.  Current Outpatient Medications on File Prior to Visit  Medication Sig Dispense Refill  . albuterol (PROVENTIL HFA;VENTOLIN HFA) 108 (90 Base) MCG/ACT inhaler Inhale 2 puffs into the lungs every 6 (six) hours as needed for wheezing or shortness of breath. 1 Inhaler 3  . ALPRAZolam (XANAX) 1 MG tablet Take 1 mg by mouth 3 (three) times daily as needed for anxiety.    Marland Kitchen amphetamine-dextroamphetamine (ADDERALL) 20 MG tablet Take 20 mg by mouth daily.    . carvedilol (COREG) 6.25 MG tablet TAKE 1 TABLET BY MOUTH TWICE DAILY 180 tablet 0  . cloNIDine (CATAPRES - DOSED IN MG/24 HR) 0.1 mg/24hr patch Place 1 patch (0.1 mg total) onto the skin once a week. 4 patch 12  . cloNIDine (CATAPRES - DOSED IN MG/24 HR) 0.1 mg/24hr patch Place 1 patch (0.1 mg total) onto the skin once a week. 12 patch 3  . DiphenhydrAMINE HCl (BENADRYL ALLERGY PO) Take 25 mg by mouth. Reported on 08/16/2015    . EPINEPHrine 0.3 mg/0.3 mL IJ SOAJ injection Inject 0.3 mLs (0.3 mg total) into the muscle once. 1 Device 2  . escitalopram (LEXAPRO) 20 MG tablet Take 20 mg by mouth daily.    . fluticasone (FLOVENT HFA) 110 MCG/ACT inhaler Inhale 2 puffs into the lungs 2 (two) times daily. 1 Inhaler 1  . hydrochlorothiazide (HYDRODIURIL) 25 MG tablet TAKE 1 TABLET BY MOUTH DAILY 90 tablet 1  . ipratropium-albuterol (DUONEB) 0.5-2.5 (3) MG/3ML SOLN Take 3 mLs by nebulization every 6 (six) hours as needed. 360 mL 1  . metFORMIN (GLUCOPHAGE) 500 MG tablet Take 2 tablets (1,000 mg total) by mouth daily  with breakfast. 180 tablet 1  . montelukast (SINGULAIR) 10 MG tablet Take 1 tablet (10 mg total) by mouth at bedtime. 30 tablet 3  . potassium chloride SA (K-DUR,KLOR-CON) 20 MEQ tablet Take 1 tablet (20 mEq total) by mouth daily. 90 tablet 1   No current facility-administered medications on file  prior to visit.     Review of Systems:  As per HPI- otherwise negative. No fever chills, no chest pain or shortness of breath  Physical Examination: Vitals:   07/23/18 1643  BP: (!) 142/100  Pulse: 85  Resp: 18  Temp: 98.5 F (36.9 C)  SpO2: 97%   Vitals:   07/23/18 1643  Weight: 250 lb (113.4 kg)  Height: 5\' 5"  (1.651 m)   Body mass index is 41.6 kg/m. Ideal Body Weight: Weight in (lb) to have BMI = 25: 149.9  GEN: WDWN, NAD, Non-toxic, A & O x 3, obese, looks well HEENT: Atraumatic, Normocephalic. Neck supple. No masses, No LAD. Ears and Nose: No external deformity. CV: RRR, No M/G/R. No JVD. No thrill. No extra heart sounds. PULM: CTA B, no wheezes, crackles, rhonchi. No retractions. No resp. distress. No accessory muscle use. ABD: S, NT, ND, +BS. No rebound. No HSM. EXTR: No c/c/e NEURO Normal gait.  PSYCH: Normally interactive. Conversant. Not depressed or anxious appearing.  Calm demeanor.    Assessment and Plan: Controlled type 2 diabetes mellitus without complication, without long-term current use of insulin ([facility]) - Plan: Hemoglobin A1c, Lipid panel, Microalbumin / creatinine urine ratio  Essential hypertension - Plan: carvedilol (COREG) 6.25 MG tablet, hydrochlorothiazide (HYDRODIURIL) 25 MG tablet, CBC, Comprehensive metabolic panel  Bronchospasm - Plan: ipratropium-albuterol (DUONEB) 0.5-2.5 (3) MG/3ML SOLN, montelukast (SINGULAIR) 10 MG tablet, albuterol (PROVENTIL HFA;VENTOLIN HFA) 108 (90 Base) MCG/ACT inhaler  Hypokalemia - Plan: potassium chloride SA (K-DUR,KLOR-CON) 20 MEQ tablet  Following up today.  We will check labs for her diabetes. Her blood pressure  is generally been under control, she is quite upset today about losing her job.  This may be why her pressure is elevated slightly today  BP Readings from Last 3 Encounters:  07/23/18 (!) 142/100  02/12/18 126/90  03/12/17 (!) 141/90   Patient has to get a automated breathalyzer and start on her car.  However, she has not been able to blow hard enough to trigger the mechanism.  She is try to get a medical accommodation, which requires her to see 2 different pulmonologist and have 2 different spirometry test. I have placed referral to pulmonology for her today We will be in touch with her pending her results Refilled her medications today, she has bronchospasm but no formal diagnosis of asthma.  Signed Lamar Blinks, MD   Addendum 07/25/2018.  Received her labs  Results for orders placed or performed in visit on 07/23/18  CBC  Result Value Ref Range   WBC 7.5 4.0 - 10.5 K/uL   RBC 5.55 (H) 3.87 - 5.11 Mil/uL   Platelets 263.0 150.0 - 400.0 K/uL   Hemoglobin 13.8 12.0 - 15.0 g/dL   HCT 41.5 36.0 - 46.0 %   MCV 74.7 (L) 78.0 - 100.0 fl   MCHC 33.3 30.0 - 36.0 g/dL   RDW 15.8 (H) 11.5 - 15.5 %  Comprehensive metabolic panel  Result Value Ref Range   Sodium 139 135 - 145 mEq/L   Potassium 4.2 3.5 - 5.1 mEq/L   Chloride 100 96 - 112 mEq/L   CO2 30 19 - 32 mEq/L   Glucose, Bld 83 70 - 99 mg/dL   BUN 20 6 - 23 mg/dL   Creatinine, Ser 0.69 0.40 - 1.20 mg/dL   Total Bilirubin 0.3 0.2 - 1.2 mg/dL   Alkaline Phosphatase 74 39 - 117 U/L   AST 19 0 - 37 U/L   ALT 19 0 -  35 U/L   Total Protein 7.5 6.0 - 8.3 g/dL   Albumin 4.3 3.5 - 5.2 g/dL   Calcium 9.9 8.4 - 10.5 mg/dL   GFR 109.48 >60.00 mL/min  Hemoglobin A1c  Result Value Ref Range   Hgb A1c MFr Bld 6.1 4.6 - 6.5 %  Lipid panel  Result Value Ref Range   Cholesterol 267 (H) 0 - 200 mg/dL   Triglycerides 157.0 (H) 0.0 - 149.0 mg/dL   HDL 39.10 >39.00 mg/dL   VLDL 31.4 0.0 - 40.0 mg/dL   LDL Cholesterol 197 (H) 0 - 99 mg/dL    Total CHOL/HDL Ratio 7    NonHDL 228.09   Microalbumin / creatinine urine ratio  Result Value Ref Range   Microalb, Ur 0.8 0.0 - 1.9 mg/dL   Creatinine,U 73.9 mg/dL   Microalb Creat Ratio 1.1 0.0 - 30.0 mg/g   Message to patient She is on potassium 20 mEq a day She has not been able to tolerate statins The 10-year ASCVD risk score Mikey Bussing DC Jr., et al., 2013) is: 22.6%   Values used to calculate the score:     Age: 75 years     Sex: Female     Is Non-Hispanic African American: Yes     Diabetic: Yes     Tobacco smoker: No     Systolic Blood Pressure: 030 mmHg     Is BP treated: Yes     HDL Cholesterol: 39.1 mg/dL     Total Cholesterol: 267 mg/dL

## 2018-07-23 ENCOUNTER — Encounter: Payer: Self-pay | Admitting: Family Medicine

## 2018-07-23 ENCOUNTER — Ambulatory Visit: Payer: 59 | Admitting: Family Medicine

## 2018-07-23 VITALS — BP 142/100 | HR 85 | Temp 98.5°F | Resp 18 | Ht 65.0 in | Wt 250.0 lb

## 2018-07-23 DIAGNOSIS — J9801 Acute bronchospasm: Secondary | ICD-10-CM | POA: Diagnosis not present

## 2018-07-23 DIAGNOSIS — E119 Type 2 diabetes mellitus without complications: Secondary | ICD-10-CM | POA: Diagnosis not present

## 2018-07-23 DIAGNOSIS — E876 Hypokalemia: Secondary | ICD-10-CM

## 2018-07-23 DIAGNOSIS — I10 Essential (primary) hypertension: Secondary | ICD-10-CM | POA: Diagnosis not present

## 2018-07-23 MED ORDER — POTASSIUM CHLORIDE CRYS ER 20 MEQ PO TBCR: 20.0000 meq | EXTENDED_RELEASE_TABLET | Freq: Every day | ORAL | 3 refills | Status: DC

## 2018-07-23 MED ORDER — CARVEDILOL 6.25 MG PO TABS: 6.2500 mg | ORAL_TABLET | Freq: Two times a day (BID) | ORAL | 3 refills | Status: DC

## 2018-07-23 MED ORDER — IPRATROPIUM-ALBUTEROL 0.5-2.5 (3) MG/3ML IN SOLN: 3.0000 mL | Freq: Four times a day (QID) | RESPIRATORY_TRACT | 1 refills | Status: DC | PRN

## 2018-07-23 MED ORDER — HYDROCHLOROTHIAZIDE 25 MG PO TABS: 25.0000 mg | ORAL_TABLET | Freq: Every day | ORAL | 3 refills | Status: DC

## 2018-07-23 MED ORDER — ALBUTEROL SULFATE HFA 108 (90 BASE) MCG/ACT IN AERS: 2.0000 | INHALATION_SPRAY | Freq: Four times a day (QID) | RESPIRATORY_TRACT | 3 refills | Status: DC | PRN

## 2018-07-23 MED ORDER — MONTELUKAST SODIUM 10 MG PO TABS: 10.0000 mg | ORAL_TABLET | Freq: Every day | ORAL | 3 refills | Status: DC

## 2018-07-23 NOTE — Patient Instructions (Signed)
It was nice to see you today, but I am sorry you are having such a hard day today. We will work on getting in with pulmonology as soon as possible.  Just explain your situation, I think it makes sense to have you see two pulmonologists in the same practice.  I refilled your medications today, and we will also check on your blood work.  I will be in touch with the results as soon as possible

## 2018-07-24 LAB — COMPREHENSIVE METABOLIC PANEL
ALK PHOS: 74 U/L (ref 39–117)
ALT: 19 U/L (ref 0–35)
AST: 19 U/L (ref 0–37)
Albumin: 4.3 g/dL (ref 3.5–5.2)
BUN: 20 mg/dL (ref 6–23)
CO2: 30 mEq/L (ref 19–32)
Calcium: 9.9 mg/dL (ref 8.4–10.5)
Chloride: 100 mEq/L (ref 96–112)
Creatinine, Ser: 0.69 mg/dL (ref 0.40–1.20)
GFR: 109.48 mL/min (ref >60.00)
GLUCOSE: 83 mg/dL (ref 70–99)
Potassium: 4.2 mEq/L (ref 3.5–5.1)
Sodium: 139 mEq/L (ref 135–145)
TOTAL PROTEIN: 7.5 g/dL (ref 6.0–8.3)
Total Bilirubin: 0.3 mg/dL (ref 0.2–1.2)

## 2018-07-24 LAB — LIPID PANEL
CHOL/HDL RATIO: 7
Cholesterol: 267 mg/dL — ABNORMAL HIGH (ref 0–200)
HDL: 39.1 mg/dL (ref >39.00)
LDL Cholesterol: 197 mg/dL — ABNORMAL HIGH (ref 0–99)
NonHDL: 228.09
Triglycerides: 157 mg/dL — ABNORMAL HIGH (ref 0.0–149.0)
VLDL: 31.4 mg/dL (ref 0.0–40.0)

## 2018-07-24 LAB — MICROALBUMIN / CREATININE URINE RATIO
CREATININE, U: 73.9 mg/dL
Microalb Creat Ratio: 1.1 mg/g (ref 0.0–30.0)
Microalb, Ur: 0.8 mg/dL (ref 0.0–1.9)

## 2018-07-24 LAB — CBC
HCT: 41.5 % (ref 36.0–46.0)
Hemoglobin: 13.8 g/dL (ref 12.0–15.0)
MCHC: 33.3 g/dL (ref 30.0–36.0)
MCV: 74.7 fl — ABNORMAL LOW (ref 78.0–100.0)
Platelets: 263 10*3/uL (ref 150.0–400.0)
RBC: 5.55 Mil/uL — ABNORMAL HIGH (ref 3.87–5.11)
RDW: 15.8 % — AB (ref 11.5–15.5)
WBC: 7.5 10*3/uL (ref 4.0–10.5)

## 2018-07-24 LAB — HEMOGLOBIN A1C: Hgb A1c MFr Bld: 6.1 % (ref 4.6–6.5)

## 2018-07-25 ENCOUNTER — Encounter: Payer: Self-pay | Admitting: Family Medicine

## 2018-07-25 DIAGNOSIS — E785 Hyperlipidemia, unspecified: Secondary | ICD-10-CM

## 2018-07-25 MED ORDER — SIMVASTATIN 10 MG PO TABS: 10.0000 mg | ORAL_TABLET | Freq: Every day | ORAL | 3 refills | Status: DC

## 2018-07-30 ENCOUNTER — Institutional Professional Consult (permissible substitution): Payer: 59 | Admitting: Pulmonary Disease

## 2018-07-31 ENCOUNTER — Encounter: Payer: Self-pay | Admitting: Pulmonary Disease

## 2018-07-31 ENCOUNTER — Ambulatory Visit: Payer: 59 | Admitting: Pulmonary Disease

## 2018-07-31 VITALS — BP 158/78 | HR 85 | Ht 65.0 in | Wt 253.4 lb

## 2018-07-31 DIAGNOSIS — R05 Cough: Secondary | ICD-10-CM | POA: Diagnosis not present

## 2018-07-31 DIAGNOSIS — R059 Cough, unspecified: Secondary | ICD-10-CM

## 2018-07-31 LAB — POCT EXHALED NITRIC OXIDE: FeNO level (ppb): 5

## 2018-07-31 MED ORDER — BUDESONIDE-FORMOTEROL FUMARATE 160-4.5 MCG/ACT IN AERO: 2.0000 | INHALATION_SPRAY | Freq: Two times a day (BID) | RESPIRATORY_TRACT | 6 refills | Status: DC

## 2018-07-31 MED ORDER — PREDNISONE 10 MG PO TABS: ORAL_TABLET | ORAL | 0 refills | Status: DC

## 2018-07-31 MED ORDER — FLUTICASONE PROPIONATE 50 MCG/ACT NA SUSP: 1.0000 | Freq: Every day | NASAL | 2 refills | Status: DC

## 2018-07-31 NOTE — Progress Notes (Signed)
   Subjective:    Patient ID: Gabriela Torres, female    DOB: 22-Jun-1971, 48 y.o.   MRN: [id number]  HPI    Review of Systems  Constitutional: Negative for fever and unexpected weight change.  HENT: Positive for congestion and sinus pressure. Negative for dental problem, ear pain, nosebleeds, postnasal drip, rhinorrhea, sneezing, sore throat and trouble swallowing.   Eyes: Negative for redness and itching.  Respiratory: Positive for cough, chest tightness, shortness of breath and wheezing.   Cardiovascular: Negative for palpitations and leg swelling.  Gastrointestinal: Negative for nausea and vomiting.  Genitourinary: Negative for dysuria.  Musculoskeletal: Negative for joint swelling.  Skin: Negative for rash.  Allergic/Immunologic: Negative.  Negative for environmental allergies, food allergies and immunocompromised state.  Neurological: Positive for headaches.  Hematological: Does not bruise/bleed easily.  Psychiatric/Behavioral: Negative for dysphoric mood. The patient is nervous/anxious.        Objective:   Physical Exam        Assessment & Plan:

## 2018-07-31 NOTE — Patient Instructions (Signed)
Prednisone 10 mg pill >> 3 pills daily for 2 days, 2 pills daily for 2 days, 1 pill daily for 2 days Symbicort two puffs twice per day and rinse mouth after each use Albuterol two puffs every 6 hours as needed for cough, wheeze, and chest congestion Flonase 1 spray in each nostril daily Singulair 10 mg pill nightly  Follow up in 2 weeks with Dr. Halford Chessman or Nurse Practitioner

## 2018-07-31 NOTE — Progress Notes (Signed)
[facility]  Chief Complaint  Patient presents with  . pulm consult    Pt referred by Dr. Janett Billow Copland for Bronchospasm. Pt has productive cough-yellow/tan thick mucus, chest tightness, SOB, wheezing and not breathing well. Inhalers and nebs are not helping much, only one hour releif.    Constitutional:  BP (!) 158/78 (BP Location: Left Arm, Cuff Size: Normal)   Pulse 85   Ht 5\' 5"  (1.651 m)   Wt 253 lb 6.4 oz (114.9 kg)   SpO2 95%   BMI 42.17 kg/m   Past Medical History:  Migraine HA, HTN, HLD, Anxiety, ADD  Brief Summary:  Gabriela Torres is a 48 y.o. female with cough.  She gets episodes of cough and chest tightness during the Winter.  This has been going on for years.  She had pneumonia few years ago.  She isn't sure if she has asthma.  She has been on inhalers and singulair.  Flovent used to work, but then wasn't as effective.  She isn't using this anymore.  She uses albuterol 5 or 6 times per day.  This used to help more, but not as effective recently.  She gets sinus congestion with post nasal drip.  She also gets wheezing and chest tightness.  She will bring up white sputum.  She smoked for brief period of time in college.  She works as a Technical brewer.  She used to have pet cats, dogs but hasn't had any pets for past several months.  She is from [place].   Physical Exam:   Appearance - well kempt   ENMT - clear nasal mucosa, midline nasal  septum, no oral exudates, no LAN, trachea midline  Respiratory - normal chest wall, normal respiratory effort, no accessory muscle use, no wheeze/rales  CV - s1s2 regular rate and rhythm, no murmurs, no peripheral edema, radial pulses symmetric  GI - soft, non tender, no masses  Lymph - no adenopathy noted in neck and axillary areas  MSK - normal gait  Ext - no cyanosis, clubbing, or joint inflammation noted  Skin - no rashes, lesions, or ulcers  Neuro - normal strength, oriented x  3  Psych - normal mood and affect   Discussion:  She likely has allergic rhinitis and asthma triggering her symptoms.  Assessment/Plan:   Allergic asthma. - will give course of prednisone - add symbicort - continue singulair - advised her to limit use of albuterol - spirometry completed for DMV; she will need another spirometry completed next week and reviewed by a separate physician  Allergic rhinitis. - flonase, singulair    Patient Instructions  Prednisone 10 mg pill >> 3 pills daily for 2 days, 2 pills daily for 2 days, 1 pill daily for 2 days Symbicort two puffs twice per day and rinse mouth after each use Albuterol two puffs every 6 hours as needed for cough, wheeze, and chest congestion Flonase 1 spray in each nostril daily Singulair 10 mg pill nightly  Follow up in 2 weeks with Dr. Halford Chessman or Nurse Practitioner    Chesley Mires, MD [place] Pager: [phone] 07/31/2018, 5:37 PM  Flow Sheet     Pulmonary tests:  Spirometry 07/31/18 >> FEV1 1.7 (70%), FEV1% 89 FeNO 07/31/18 >> 5  Review of Systems:  Review of Systems  Constitutional: Negative for fever and unexpected weight change.  HENT: Positive for congestion and sinus pressure. Negative for dental problem, ear pain, nosebleeds, postnasal drip, rhinorrhea, sneezing,  sore throat and trouble swallowing.   Eyes: Negative for redness and itching.  Respiratory: Positive for cough, chest tightness, shortness of breath and wheezing.   Cardiovascular: Negative for palpitations and leg swelling.  Gastrointestinal: Negative for nausea and vomiting.  Genitourinary: Negative for dysuria.  Musculoskeletal: Negative for joint swelling.  Skin: Negative for rash.  Allergic/Immunologic: Negative.  Negative for environmental allergies, food allergies and immunocompromised state.  Neurological: Positive for headaches.  Hematological: Does not bruise/bleed easily.  Psychiatric/Behavioral: Negative for  dysphoric mood. The patient is nervous/anxious.    Medications:   Allergies as of 07/31/2018      Reactions   Statins Other (See Comments)   Left lower and rear flank pain.   Lisinopril    Angioedema   Norvasc [amlodipine] Other (See Comments)   Gum hyperplasia   Food Nausea Only   Dill, rye   Guaifenesin & Derivatives Nausea And Vomiting      Medication List       Accurate as of July 31, 2018  5:37 PM. Always use your most recent med list.        albuterol 108 (90 Base) MCG/ACT inhaler Commonly known as:  PROVENTIL HFA;VENTOLIN HFA Inhale 2 puffs into the lungs every 6 (six) hours as needed for wheezing or shortness of breath.   ALPRAZolam 1 MG tablet Commonly known as:  XANAX Take 1 mg by mouth 3 (three) times daily as needed for anxiety.   amphetamine-dextroamphetamine 20 MG tablet Commonly known as:  ADDERALL Take 20 mg by mouth daily.   BENADRYL ALLERGY PO Take 25 mg by mouth. Reported on 08/16/2015   budesonide-formoterol 160-4.5 MCG/ACT inhaler Commonly known as:  SYMBICORT Inhale 2 puffs into the lungs 2 (two) times daily.   carvedilol 6.25 MG tablet Commonly known as:  COREG Take 1 tablet (6.25 mg total) by mouth 2 (two) times daily.   cloNIDine 0.1 mg/24hr patch Commonly known as:  CATAPRES - Dosed in mg/24 hr Place 1 patch (0.1 mg total) onto the skin once a week.   cloNIDine 0.1 mg/24hr patch Commonly known as:  CATAPRES - Dosed in mg/24 hr Place 1 patch (0.1 mg total) onto the skin once a week.   EPINEPHrine 0.3 mg/0.3 mL Soaj injection Commonly known as:  EPI-PEN Inject 0.3 mLs (0.3 mg total) into the muscle once.   escitalopram 20 MG tablet Commonly known as:  LEXAPRO Take 20 mg by mouth daily.   fluticasone 50 MCG/ACT nasal spray Commonly known as:  FLONASE Place 1 spray into both nostrils daily.   hydrochlorothiazide 25 MG tablet Commonly known as:  HYDRODIURIL Take 1 tablet (25 mg total) by mouth daily.   ipratropium-albuterol  0.5-2.5 (3) MG/3ML Soln Commonly known as:  DUONEB Take 3 mLs by nebulization every 6 (six) hours as needed.   metFORMIN 500 MG tablet Commonly known as:  GLUCOPHAGE Take 2 tablets (1,000 mg total) by mouth daily with breakfast.   montelukast 10 MG tablet Commonly known as:  SINGULAIR Take 1 tablet (10 mg total) by mouth at bedtime.   potassium chloride SA 20 MEQ tablet Commonly known as:  K-DUR,KLOR-CON Take 1 tablet (20 mEq total) by mouth daily.   predniSONE 10 MG tablet Commonly known as:  DELTASONE 3 pills daily for 2 days, 2 pills daily for 2 days, 1 pill daily for 2 days   simvastatin 10 MG tablet Commonly known as:  ZOCOR Take 1 tablet (10 mg total) by mouth daily.  Past Surgical History:  She  has a past surgical history that includes Tonsillectomy (2008); Nasal turbinate reduction (2000); Abdominal hysterectomy (09/2003 or 09/2004 ); Ventral hernia repair (11/01/2011); and Hernia repair.  Family History:  Her family history includes Cancer in her father and mother; Heart disease in her sister.  Social History:  She  reports that she quit smoking about 36 years ago. Her smoking use included cigarettes. She has a 0.25 pack-year smoking history. She has never used smokeless tobacco. She reports current alcohol use. She reports that she does not use drugs.

## 2018-08-01 ENCOUNTER — Encounter: Payer: Self-pay | Admitting: Family Medicine

## 2018-08-04 NOTE — Progress Notes (Signed)
@Patient  ID: Gabriela Torres, female    DOB: 14-Jun-1971, 48 y.o.   MRN: [id number]  Chief Complaint  Patient presents with  . Follow-up    Asthma follow-up, needs spirometry    Referring provider: Copland, Gay Filler, MD  HPI:  48 year old female former smoker initially referred to our office on 07/31/2018 for evaluation of cough  PMH: Migraine HA, HTN, HLD, Anxiety, ADD Smoker/ Smoking History: Former smoker.  Quit 1983. Maintenance: Symbicort 160 Pt of: Dr. Halford Chessman   08/05/2018  - Visit   48 year old female former smoker presenting to our office today as a follow-up visit.  Patient was initially consulted on 07/31/2018 with suspected asthma.  Spirometry shows no formal obstruction but does show restriction.  Patient will also need another spirometry here for to complete a DMV form.  Patient reports she has had lifelong allergies with known triggers to dog dander, grasses, smoke, but never has had formal allergy testing.  Patient is maintained on Singulair.  Patient is also on Flonase.  Patient was started on Symbicort 160 twice daily at last office visit but patient unfortunately was only taking it 2 puffs daily.  Patient does report that her "bronchial spasms" have lessened.  Patient is also completing a prednisone taper from Dr. said.  Patient feels that symptoms have overall improved but are still present.  Patient does still have a dry cough that is worse when laying flat and at night.  Patient denies any recent symptoms of acid reflux or GERD.  Patient is not following a special diet at this time.  Patient reports that she has recurrent bronchitis episodes every winter ever since 17 years ago when she had a bad case of bronchitis which lasted months.  Patient does report that this year the bronchitis and bronchial spasms that she is experiencing were worse than usual.  Which caused her to present to our office.   Tests:  Spirometry 07/31/18 >> FEV1 1.7 (70%), FEV1% 89  08/05/2018-office  spirometry-FVC 2.3 (75% predicted), ratio 82, FEV1 1.9 (76% predicted) >>>Normal spirometry  FeNO 07/31/18 >> 5  FENO:  No results found for: NITRICOXIDE  PFT: No flowsheet data found.  Imaging: No results found.    Specialty Problems      Pulmonary Problems   Asthma   Cough      Allergies  Allergen Reactions  . Statins Other (See Comments)    Left lower and rear flank pain.  . Lisinopril     Angioedema   . Norvasc [Amlodipine] Other (See Comments)    Gum hyperplasia  . Food Nausea Only    Dill, rye  . Guaifenesin & Derivatives Nausea And Vomiting    Immunization History  Administered Date(s) Administered  . Hepatitis A, Adult 12/18/2015  . Influenza Whole 04/08/2018  . Influenza,inj,Quad PF,6+ Mos 04/16/2013, 08/16/2015  . Tdap 06/07/2014   Consider Pneumovax 23 at next office visit  Past Medical History:  Diagnosis Date  . Abdominal pain   . Abdominal wall hernia   . Anxiety   . Hearing loss   . Hyperlipidemia   . Hypertension   . Migraines     Tobacco History: Social History   Tobacco Use  Smoking Status Former Smoker  . Packs/day: 0.25  . Years: 1.00  . Pack years: 0.25  . Types: Cigarettes  . Last attempt to quit: 10/24/1981  . Years since quitting: 36.8  Smokeless Tobacco Never Used  Tobacco Comment   social drinker   Counseling given: Yes  Comment: social drinker  Continue to not drink continue to not smoke  Outpatient Encounter Medications as of 08/05/2018  Medication Sig  . albuterol (PROVENTIL HFA;VENTOLIN HFA) 108 (90 Base) MCG/ACT inhaler Inhale 2 puffs into the lungs every 6 (six) hours as needed for wheezing or shortness of breath.  . ALPRAZolam (XANAX) 1 MG tablet Take 1 mg by mouth 3 (three) times daily as needed for anxiety.  Marland Kitchen amphetamine-dextroamphetamine (ADDERALL) 20 MG tablet Take 20 mg by mouth daily.  . budesonide-formoterol (SYMBICORT) 160-4.5 MCG/ACT inhaler Inhale 2 puffs into the lungs 2 (two) times daily.  .  carvedilol (COREG) 6.25 MG tablet Take 1 tablet (6.25 mg total) by mouth 2 (two) times daily.  . cloNIDine (CATAPRES - DOSED IN MG/24 HR) 0.1 mg/24hr patch Place 1 patch (0.1 mg total) onto the skin once a week.  . cloNIDine (CATAPRES - DOSED IN MG/24 HR) 0.1 mg/24hr patch Place 1 patch (0.1 mg total) onto the skin once a week.  . DiphenhydrAMINE HCl (BENADRYL ALLERGY PO) Take 25 mg by mouth. Reported on 08/16/2015  . EPINEPHrine 0.3 mg/0.3 mL IJ SOAJ injection Inject 0.3 mLs (0.3 mg total) into the muscle once.  . escitalopram (LEXAPRO) 20 MG tablet Take 20 mg by mouth daily.  . fluticasone (FLONASE) 50 MCG/ACT nasal spray Place 1 spray into both nostrils daily.  . hydrochlorothiazide (HYDRODIURIL) 25 MG tablet Take 1 tablet (25 mg total) by mouth daily.  Marland Kitchen ipratropium-albuterol (DUONEB) 0.5-2.5 (3) MG/3ML SOLN Take 3 mLs by nebulization every 6 (six) hours as needed.  . metFORMIN (GLUCOPHAGE) 500 MG tablet Take 2 tablets (1,000 mg total) by mouth daily with breakfast.  . montelukast (SINGULAIR) 10 MG tablet Take 1 tablet (10 mg total) by mouth at bedtime.  . potassium chloride SA (K-DUR,KLOR-CON) 20 MEQ tablet Take 1 tablet (20 mEq total) by mouth daily.  . predniSONE (DELTASONE) 10 MG tablet 3 pills daily for 2 days, 2 pills daily for 2 days, 1 pill daily for 2 days  . simvastatin (ZOCOR) 10 MG tablet Take 1 tablet (10 mg total) by mouth daily.  Marland Kitchen omeprazole (PRILOSEC) 20 MG capsule Take 1 capsule (20 mg total) by mouth daily.   No facility-administered encounter medications on file as of 08/05/2018.      Review of Systems  Review of Systems  Constitutional: Negative for fatigue and fever.  HENT: Negative for congestion, sinus pressure and sinus pain.   Respiratory: Positive for cough (dry cough, occasionally productive with white sputum, changes from ), shortness of breath and wheezing.   Cardiovascular: Positive for chest pain (with bronchial spasms ?  Epigastric pain). Negative for  palpitations.  Gastrointestinal: Negative for diarrhea, nausea and vomiting.       +denies acid reflux, does endorse epigastric pain     Physical Exam  BP 118/78 (BP Location: Left Wrist, Cuff Size: Normal)   Pulse 81   Ht 5\' 5"  (1.651 m)   Wt 253 lb 9.6 oz (115 kg)   SpO2 96%   BMI 42.20 kg/m   Wt Readings from Last 5 Encounters:  08/05/18 253 lb 9.6 oz (115 kg)  07/31/18 253 lb 6.4 oz (114.9 kg)  07/23/18 250 lb (113.4 kg)  02/12/18 237 lb (107.5 kg)  03/12/17 248 lb 9.6 oz (112.8 kg)    Physical Exam  Constitutional: She is oriented to person, place, and time and well-developed, well-nourished, and in no distress. No distress.  HENT:  Head: Normocephalic and atraumatic.  Right Ear: Hearing,  tympanic membrane, external ear and ear canal normal.  Left Ear: Hearing, tympanic membrane, external ear and ear canal normal.  Nose: Mucosal edema and rhinorrhea present. Right sinus exhibits no maxillary sinus tenderness and no frontal sinus tenderness. Left sinus exhibits no maxillary sinus tenderness and no frontal sinus tenderness.  Mouth/Throat: Uvula is midline and oropharynx is clear and moist. No oropharyngeal exudate.  + Postnasal drip  Eyes: Pupils are equal, round, and reactive to light.  Neck: Normal range of motion. Neck supple.  Cardiovascular: Normal rate, regular rhythm and normal heart sounds.  Pulmonary/Chest: Effort normal and breath sounds normal. No accessory muscle usage. No respiratory distress. She has no decreased breath sounds. She has no wheezes. She has no rhonchi.  Abdominal: Soft. Bowel sounds are normal. There is no abdominal tenderness.  Musculoskeletal: Normal range of motion.        General: No edema.  Lymphadenopathy:    She has no cervical adenopathy.  Neurological: She is alert and oriented to person, place, and time. Gait normal.  Skin: Skin is warm and dry. She is not diaphoretic. No erythema.  Psychiatric: Memory and judgment normal. Her mood  appears anxious. She has a flat affect.  + Anxious and fidgety throughout exam  Nursing note and vitals reviewed.     Lab Results:  CBC    Component Value Date/Time   WBC 7.5 07/23/2018 1718   RBC 5.55 (H) 07/23/2018 1718   HGB 13.8 07/23/2018 1718   HCT 41.5 07/23/2018 1718   PLT 263.0 07/23/2018 1718   MCV 74.7 (L) 07/23/2018 1718   MCV 77.0 (A) 04/06/2014 1106   MCH 24.4 (L) 08/16/2015 0844   MCHC 33.3 07/23/2018 1718   RDW 15.8 (H) 07/23/2018 1718    BMET    Component Value Date/Time   NA 139 07/23/2018 1718   NA 138 07/14/2012 0000   K 4.2 07/23/2018 1718   CL 100 07/23/2018 1718   CO2 30 07/23/2018 1718   GLUCOSE 83 07/23/2018 1718   BUN 20 07/23/2018 1718   BUN 19 07/14/2012 0000   CREATININE 0.69 07/23/2018 1718   CREATININE 0.73 08/16/2015 0844   CALCIUM 9.9 07/23/2018 1718   GFRNONAA 99 07/14/2012 0000   GFRAA 114 07/14/2012 0000    BNP No results found for: BNP  ProBNP No results found for: PROBNP    Assessment & Plan:     Asthma Assessment: Recurrent bronchitis-like episodes for the last 17 years every winter Spirometry today is normal Spirometry earlier this week did show slight restriction BMI 42.2 Former smoker, small smoking history Patient reports allergy triggers of dog dander, grasses and smoke Patient only taking Symbicort 160 2 puffs daily Lungs are clear to auscultation today  Plan: Start Symbicort 160 twice daily, mouth afterwards Can use albuterol rescue inhaler as needed for shortness of breath and wheezing Follow-up in 8 weeks with Dr. Halford Chessman with a pulmonary function test Continue to avoid known allergy triggers Continue daily Singulair Finish prednisone taper Continue Flonase 1 spray each nostril Spirometry today is normal    Morbid obesity with BMI of 40.0-44.9, adult ([place]) Assessment: BMI 42.2 Restrictive pattern on spirometry earlier this week  Plan: Actively work on losing [employer] following diet for  acid reflux Work on daily exercise  Cough Assessment: Continued cough that improves with prednisone as well as Symbicort 160 Patient endorses worsened cough when lying flat and later at night Patient describing epigastric pain  Plan: Continue Symbicort 160 twice daily [employer]  prednisone taper Start omeprazole 20 mg daily >>> Consider trial of PPI for the next 6 to 8 weeks Start following a GERD diet Normal spirometry today Ordered pulmonary function testing Follow-up in 8 weeks   Patient's [facility] forms signed by Dr. Vaughan Browner today after reviewing spirometry.  Handed patient signed forms by Dr. Vaughan Browner as well as Dr. Halford Chessman with copies of spirometry's at today's office visit.  The patient needs to complete these forms and follow-up with [place] [facility].    Lauraine Rinne, NP 08/05/2018   This appointment was 42 min long with over 50% of the time in direct face-to-face patient care, assessment, plan of care, and follow-up.

## 2018-08-05 ENCOUNTER — Encounter: Payer: Self-pay | Admitting: Pulmonary Disease

## 2018-08-05 ENCOUNTER — Ambulatory Visit: Payer: 59 | Admitting: Pulmonary Disease

## 2018-08-05 VITALS — BP 118/78 | HR 81 | Ht 65.0 in | Wt 253.6 lb

## 2018-08-05 DIAGNOSIS — Z6841 Body Mass Index (BMI) 40.0 and over, adult: Secondary | ICD-10-CM

## 2018-08-05 DIAGNOSIS — R059 Cough, unspecified: Secondary | ICD-10-CM

## 2018-08-05 DIAGNOSIS — R05 Cough: Secondary | ICD-10-CM

## 2018-08-05 DIAGNOSIS — J4540 Moderate persistent asthma, uncomplicated: Secondary | ICD-10-CM | POA: Diagnosis not present

## 2018-08-05 DIAGNOSIS — J45909 Unspecified asthma, uncomplicated: Secondary | ICD-10-CM | POA: Insufficient documentation

## 2018-08-05 MED ORDER — OMEPRAZOLE 20 MG PO CPDR: 20.0000 mg | DELAYED_RELEASE_CAPSULE | Freq: Every day | ORAL | 4 refills | Status: DC

## 2018-08-05 NOTE — Progress Notes (Signed)
Discussed results with patient in office.  Nothing further is needed at this time.  Berdie Malter FNP  

## 2018-08-05 NOTE — Assessment & Plan Note (Addendum)
Assessment: Recurrent bronchitis-like episodes for the last 17 years every winter Spirometry today is normal Spirometry earlier this week did show slight restriction BMI 42.2 Former smoker, small smoking history Patient reports allergy triggers of dog dander, grasses and smoke Patient only taking Symbicort 160 2 puffs daily Lungs are clear to auscultation today  Plan: Start Symbicort 160 twice daily, mouth afterwards Can use albuterol rescue inhaler as needed for shortness of breath and wheezing Follow-up in 8 weeks with Dr. Halford Chessman with a pulmonary function test Continue to avoid known allergy triggers Continue daily Singulair Finish prednisone taper Continue Flonase 1 spray each nostril Spirometry today is normal

## 2018-08-05 NOTE — Patient Instructions (Addendum)
Spirometry today >>> DMV form will be completed and signed by Dr. Vaughan Browner  Continue Symbicort 160 >>> 2 puffs in the morning right when you wake up, rinse out your mouth after use, 12 hours later 2 puffs, rinse after use >>> Take this daily, no matter what >>> This is not a rescue inhaler   Only use your albuterol as a rescue medication to be used if you can't catch your breath by resting or doing a relaxed purse lip breathing pattern.  - The less you use it, the better it will work when you need it. - Ok to use up to 2 puffs  every 4 hours if you must but call for immediate appointment if use goes up over your usual need - Don't leave home without it !!  (think of it like the spare tire for your car)   Continue daily Singulair Continue Flonase 1 spray each nostril as needed for nasal congestion and allergy symptoms  Complete prednisone as prescribed  Omeprazole 20 mg tablet  >>>Please take 1 tablet daily 15 minutes to 30 minutes before your first meal of the day as well as before your other medications >>>Try to take at the same time each day >>>take this medication daily  GERD management: >>>Avoid laying flat until 2 hours after meals >>>Elevate head of the bed including entire chest >>>Reduce size of meals and amount of fat, acid, spices, caffeine and sweets >>>If you are smoking, Please stop! >>>Decrease alcohol consumption >>>Work on maintaining a healthy weight with normal BMI      Follow-up with Dr. Halford Chessman in 8 weeks with a pulmonary function test     It is flu season:   >>>Remember to be washing your hands regularly, using hand sanitizer, be careful to use around herself with has contact with people who are sick will increase her chances of getting sick yourself. >>> Best ways to protect herself from the flu: Receive the yearly flu vaccine, practice good hand hygiene washing with soap and also using hand sanitizer when available, eat a nutritious meals, get adequate  rest, hydrate appropriately   Please contact the office if your symptoms worsen or you have concerns that you are not improving.   Thank you for choosing [facility] for your healthcare, and for allowing [place] to partner with you on your healthcare journey. I am thankful to be able to provide care to you today.   Wyn Quaker FNP-C    Gastroesophageal Reflux Disease, Adult Gastroesophageal reflux (GER) happens when acid from the stomach flows up into the tube that connects the mouth and the stomach (esophagus). Normally, food travels down the esophagus and stays in the stomach to be digested. With GER, food and stomach acid sometimes move back up into the esophagus. You may have a disease called gastroesophageal reflux disease (GERD) if the reflux:  Happens often.  Causes frequent or very bad symptoms.  Causes problems such as damage to the esophagus. When this happens, the esophagus becomes sore and swollen (inflamed). Over time, GERD can make small holes (ulcers) in the lining of the esophagus. What are the causes? This condition is caused by a problem with the muscle between the esophagus and the stomach. When this muscle is weak or not normal, it does not close properly to keep food and acid from coming back up from the stomach. The muscle can be weak because of:  Tobacco use.  Pregnancy.  Having a certain type of hernia (hiatal hernia).  Alcohol use.  Certain foods and drinks, such as coffee, chocolate, onions, and peppermint. What increases the risk? You are more likely to develop this condition if you:  Are overweight.  Have a disease that affects your connective tissue.  Use NSAID medicines. What are the signs or symptoms? Symptoms of this condition include:  Heartburn.  Difficult or painful swallowing.  The feeling of having a lump in the throat.  A bitter taste in the mouth.  Bad breath.  Having a lot of saliva.  Having an upset or bloated  stomach.  Belching.  Chest pain. Different conditions can cause chest pain. Make sure you see your doctor if you have chest pain.  Shortness of breath or noisy breathing (wheezing).  Ongoing (chronic) cough or a cough at night.  Wearing away of the surface of teeth (tooth enamel).  Weight loss. How is this treated? Treatment will depend on how bad your symptoms are. Your doctor may suggest:  Changes to your diet.  Medicine.  Surgery. Follow these instructions at home: Eating and drinking   Follow a diet as told by your doctor. You may need to avoid foods and drinks such as: ? Coffee and tea (with or without caffeine). ? Drinks that contain alcohol. ? Energy drinks and sports drinks. ? Bubbly (carbonated) drinks or sodas. ? Chocolate and cocoa. ? Peppermint and mint flavorings. ? Garlic and onions. ? Horseradish. ? Spicy and acidic foods. These include peppers, chili powder, curry powder, vinegar, hot sauces, and BBQ sauce. ? Citrus fruit juices and citrus fruits, such as oranges, lemons, and limes. ? Tomato-based foods. These include red sauce, chili, salsa, and pizza with red sauce. ? Fried and fatty foods. These include donuts, french fries, potato chips, and high-fat dressings. ? High-fat meats. These include hot dogs, rib eye steak, sausage, ham, and bacon. ? High-fat dairy items, such as whole milk, butter, and cream cheese.  Eat small meals often. Avoid eating large meals.  Avoid drinking large amounts of liquid with your meals.  Avoid eating meals during the 2-3 hours before bedtime.  Avoid lying down right after you eat.  Do not exercise right after you eat. Lifestyle   Do not use any products that contain nicotine or tobacco. These include cigarettes, e-cigarettes, and chewing tobacco. If you need help quitting, ask your doctor.  Try to lower your stress. If you need help doing this, ask your doctor.  If you are overweight, lose an amount of weight  that is healthy for you. Ask your doctor about a safe weight loss goal. General instructions  Pay attention to any changes in your symptoms.  Take over-the-counter and prescription medicines only as told by your doctor. Do not take aspirin, ibuprofen, or other NSAIDs unless your doctor says it is okay.  Wear loose clothes. Do not wear anything tight around your waist.  Raise (elevate) the head of your bed about 6 inches (15 cm).  Avoid bending over if this makes your symptoms worse.  Keep all follow-up visits as told by your doctor. This is important. Contact a doctor if:  You have new symptoms.  You lose weight and you do not know why.  You have trouble swallowing or it hurts to swallow.  You have wheezing or a cough that keeps happening.  Your symptoms do not get better with treatment.  You have a hoarse voice. Get help right away if:  You have pain in your arms, neck, jaw, teeth, or back.  You  feel sweaty, dizzy, or light-headed.  You have chest pain or shortness of breath.  You throw up (vomit) and your throw-up looks like blood or coffee grounds.  You pass out (faint).  Your poop (stool) is bloody or black.  You cannot swallow, drink, or eat. Summary  If a person has gastroesophageal reflux disease (GERD), food and stomach acid move back up into the esophagus and cause symptoms or problems such as damage to the esophagus.  Treatment will depend on how bad your symptoms are.  Follow a diet as told by your doctor.  Take all medicines only as told by your doctor. This information is not intended to replace advice given to you by your health care provider. Make sure you discuss any questions you have with your health care provider. Document Released: 12/11/2007 Document Revised: 12/31/2017 Document Reviewed: 12/31/2017 Elsevier Interactive Patient Education  2019 [place] for Gastroesophageal Reflux Disease, Adult When you have  gastroesophageal reflux disease (GERD), the foods you eat and your eating habits are very important. Choosing the right foods can help ease your discomfort. Think about working with a nutrition specialist (dietitian) to help you make good choices. What are tips for following this plan?  Meals  Choose healthy foods that are low in fat, such as fruits, vegetables, whole grains, low-fat dairy products, and lean meat, fish, and poultry.  Eat small meals often instead of 3 large meals a day. Eat your meals slowly, and in a place where you are relaxed. Avoid bending over or lying down until 2-3 hours after eating.  Avoid eating meals 2-3 hours before bed.  Avoid drinking a lot of liquid with meals.  Cook foods using methods other than frying. Bake, grill, or broil food instead.  Avoid or limit: ? Chocolate. ? Peppermint or spearmint. ? Alcohol. ? Pepper. ? Black and decaffeinated coffee. ? Black and decaffeinated tea. ? Bubbly (carbonated) soft drinks. ? Caffeinated energy drinks and soft drinks.  Limit high-fat foods such as: ? Fatty meat or fried foods. ? Whole milk, cream, butter, or ice cream. ? Nuts and nut butters. ? Pastries, donuts, and sweets made with butter or shortening.  Avoid foods that cause symptoms. These foods may be different for everyone. Common foods that cause symptoms include: ? Tomatoes. ? Oranges, lemons, and limes. ? Peppers. ? Spicy food. ? Onions and garlic. ? Vinegar. Lifestyle  Maintain a healthy weight. Ask your doctor what weight is healthy for you. If you need to lose weight, work with your doctor to do so safely.  Exercise for at least 30 minutes for 5 or more days each week, or as told by your doctor.  Wear loose-fitting clothes.  Do not smoke. If you need help quitting, ask your doctor.  Sleep with the head of your bed higher than your feet. Use a wedge under the mattress or blocks under the bed frame to raise the head of the  bed. Summary  When you have gastroesophageal reflux disease (GERD), food and lifestyle choices are very important in easing your symptoms.  Eat small meals often instead of 3 large meals a day. Eat your meals slowly, and in a place where you are relaxed.  Limit high-fat foods such as fatty meat or fried foods.  Avoid bending over or lying down until 2-3 hours after eating.  Avoid peppermint and spearmint, caffeine, alcohol, and chocolate. This information is not intended to replace advice given to you by your  health care provider. Make sure you discuss any questions you have with your health care provider. Document Released: 12/24/2011 Document Revised: 07/30/2016 Document Reviewed: 07/30/2016 Elsevier Interactive Patient Education  2019 [employer].

## 2018-08-05 NOTE — Assessment & Plan Note (Signed)
Assessment: BMI 42.2 Restrictive pattern on spirometry earlier this week  Plan: Actively work on losing [employer] following diet for acid reflux Work on daily exercise

## 2018-08-05 NOTE — Assessment & Plan Note (Signed)
Assessment: Continued cough that improves with prednisone as well as Symbicort 160 Patient endorses worsened cough when lying flat and later at night Patient describing epigastric pain  Plan: Continue Symbicort 160 twice daily Finish prednisone taper Start omeprazole 20 mg daily >>> Consider trial of PPI for the next 6 to 8 weeks Start following a GERD diet Normal spirometry today Ordered pulmonary function testing Follow-up in 8 weeks

## 2018-08-06 NOTE — Progress Notes (Signed)
Reviewed and agree with assessment/plan.   Shaima Sardinas, MD [facility] 07/03/2016, 12:24 PM Pager:  [phone]  

## 2018-08-18 NOTE — Telephone Encounter (Signed)
Dr. Halford Chessman please advise on patient email.  Thanks!

## 2018-08-20 MED ORDER — FLUTICASONE FUROATE-VILANTEROL 100-25 MCG/INH IN AEPB: 1.0000 | INHALATION_SPRAY | Freq: Every day | RESPIRATORY_TRACT | 6 refills | Status: DC

## 2018-08-20 NOTE — Telephone Encounter (Signed)
Please send script to change to Breo 100 one puff daily and d/c symbicort.

## 2018-08-21 NOTE — Telephone Encounter (Signed)
Pt emailed today requesting samples of Breo 100 At this time there is none available in office, advised pt to call next week and check Offered pt patient assist forms on Breo Inhaler to see if this would help Awaiting answer from patient.

## 2018-09-14 ENCOUNTER — Encounter: Payer: Self-pay | Admitting: Family Medicine

## 2018-09-14 DIAGNOSIS — Z111 Encounter for screening for respiratory tuberculosis: Secondary | ICD-10-CM

## 2018-09-15 NOTE — Addendum Note (Signed)
Addended by: Lamar Blinks C on: 09/15/2018 01:45 PM   Modules accepted: Orders

## 2018-09-16 ENCOUNTER — Other Ambulatory Visit: Payer: Self-pay

## 2018-09-16 ENCOUNTER — Other Ambulatory Visit (INDEPENDENT_AMBULATORY_CARE_PROVIDER_SITE_OTHER): Payer: Self-pay

## 2018-09-16 DIAGNOSIS — Z111 Encounter for screening for respiratory tuberculosis: Secondary | ICD-10-CM

## 2018-09-17 ENCOUNTER — Ambulatory Visit: Payer: Self-pay | Admitting: Family Medicine

## 2018-09-18 LAB — QUANTIFERON-TB GOLD PLUS
Mitogen-NIL: 0 IU/mL
NIL: 0.01 IU/mL
QuantiFERON-TB Gold Plus: UNDETERMINED — AB
TB1-NIL: 0.03 IU/mL
TB2-NIL: 0.03 IU/mL

## 2018-09-21 ENCOUNTER — Telehealth: Payer: Self-pay | Admitting: Family Medicine

## 2018-09-21 ENCOUNTER — Encounter: Payer: Self-pay | Admitting: Family Medicine

## 2018-09-21 NOTE — Telephone Encounter (Signed)
Received call from [facility] nurse at the health department.  She confirmed that with indeterminate QuantiFERON, the next step is a PPD skin test.  Will alert patient

## 2018-09-30 ENCOUNTER — Ambulatory Visit: Payer: 59 | Admitting: Pulmonary Disease

## 2018-10-02 ENCOUNTER — Telehealth: Payer: Self-pay | Admitting: Pulmonary Disease

## 2018-10-02 NOTE — Telephone Encounter (Signed)
Kelli, do you have any paperwork or follow up on the patient assistance for this patient? She is emailing asking about status.

## 2018-10-02 NOTE — Telephone Encounter (Signed)
rec'd email from patient requesting update on paper work for pt asst with Breo 200 Inhaler. I have no paperwork on this patient. I have not seen it recently in VS cubby or to do folder. Called [employer] [phone] was on hold for 67mins, and the hold time was going to be over 38mins more. Will try again to call [place]

## 2018-10-02 NOTE — Telephone Encounter (Signed)
I have no paperwork on this patient. I have not seen it recently in VS cubby or to do folder. Called [facility] 1 [phone]) [phone] was on hold for 13mins, and the hold time was going to be over 42mins more. Will try again to call [place]

## 2018-10-06 NOTE — Telephone Encounter (Signed)
Called [facility] [phone] hold time is over 58mins long. Will have to call back. Will try again to call [place]

## 2018-10-06 NOTE — Progress Notes (Signed)
[place] at [facility] [address] [place], [place] [address] [phone]  Date:  10/07/2018   Name:  Gabriela Torres   DOB:  Oct 04, 1970   MRN:  [id number]  PCP:  Darreld Mclean, MD    Chief Complaint: No chief complaint on file.   History of Present Illness:  Gabriela Torres is a 48 y.o. very pleasant female patient who presents with the following:  Virtual follow-up visit today, due to current COVID-19 outbreak Pt ID confirmed with name and DOB Patient history of well-controlled diabetes, obesity, hypertension, hyperlipidemia, asthma I last saw her in the office in January At our last visit we discussed a situation she was in with a DWI that occurred 2015.  She had to have a "blow and go" ignition lock on her car, but had not been able to blow hard enough to use the device.  She had to get an exemption from 2 separate pulmonologist  She had been on symbicort but it altered her taste, so she was changed to [facility].  She likes this better, but is trying to get patient assistance for this medication.  She was given a sample and needs to get more of this   Due for eye exam Status post total hysterectomy done in approx 2005.  This was done for fibroids. No cancer No abnl pap that she can recall  Full labs done in January; at that time her lipids were noted to be quite poor.  I prescribed her a very low-dose of simvastatin to try, as she has had issues with muscle aches in the past.?  How is this working for her; she noted some muscle tightness, but this resolved  She is taking it still   Lab Results  Component Value Date   HGBA1C 6.1 07/23/2018   Albuterol as needed adderall from Dr. Toy Care Carvedilol Lexapro 20 Flonase Breo inhaler HCTZ 25 Metformin 1000 once a day Singulair 10 Prilosec 20- prn Potassium 20 mEq daily Zocor Patient Active Problem List   Diagnosis Date Noted  . Asthma 08/05/2018  . Morbid obesity with  BMI of 40.0-44.9, adult ([place]) 08/05/2018  . Cough 08/05/2018  . Diabetes mellitus type 2, controlled, without complications ([place]) 24/58/0998  . Migraine 04/17/2013  . HTN (hypertension) 05/18/2012  . Hyperlipidemia 05/18/2012  . Ventral hernia 09/03/2011    Past Medical History:  Diagnosis Date  . Abdominal pain   . Abdominal wall hernia   . Anxiety   . Hearing loss   . Hyperlipidemia   . Hypertension   . Migraines     Past Surgical History:  Procedure Laterality Date  . ABDOMINAL HYSTERECTOMY  09/2003 or 09/2004    total   . HERNIA REPAIR    . NASAL TURBINATE REDUCTION  2000  . TONSILLECTOMY  2008  . VENTRAL HERNIA REPAIR  11/01/2011   Procedure: LAPAROSCOPIC VENTRAL HERNIA;  Surgeon: Merrie Roof, MD;  Location: [facility] OR;  Service: General;  Laterality: N/A;    Social History   Tobacco Use  . Smoking status: Former Smoker    Packs/day: 0.25    Years: 1.00    Pack years: 0.25    Types: Cigarettes    Last attempt to quit: 10/24/1981    Years since quitting: 36.9  . Smokeless tobacco: Never Used  . Tobacco comment: social drinker  Substance Use Topics  . Alcohol use: Yes    Comment: 3 -  4 per week  . Drug use: No    Family History  Problem Relation Age of Onset  . Cancer Mother        abdominal  . Cancer Father        colon  . Heart disease Sister        chf    Allergies  Allergen Reactions  . Statins Other (See Comments)    Left lower and rear flank pain.  . Lisinopril     Angioedema   . Norvasc [Amlodipine] Other (See Comments)    Gum hyperplasia  . Food Nausea Only    Dill, rye  . Guaifenesin & Derivatives Nausea And Vomiting    Medication list has been reviewed and updated.  Current Outpatient Medications on File Prior to Visit  Medication Sig Dispense Refill  . albuterol (PROVENTIL HFA;VENTOLIN HFA) 108 (90 Base) MCG/ACT inhaler Inhale 2 puffs into the lungs every 6 (six) hours as needed for wheezing or shortness of breath. 1 Inhaler 3  .  ALPRAZolam (XANAX) 1 MG tablet Take 1 mg by mouth 3 (three) times daily as needed for anxiety.    Marland Kitchen amphetamine-dextroamphetamine (ADDERALL) 20 MG tablet Take 20 mg by mouth daily.    . carvedilol (COREG) 6.25 MG tablet Take 1 tablet (6.25 mg total) by mouth 2 (two) times daily. 180 tablet 3  . DiphenhydrAMINE HCl (BENADRYL ALLERGY PO) Take 25 mg by mouth. Reported on 08/16/2015    . EPINEPHrine 0.3 mg/0.3 mL IJ SOAJ injection Inject 0.3 mLs (0.3 mg total) into the muscle once. 1 Device 2  . escitalopram (LEXAPRO) 20 MG tablet Take 20 mg by mouth daily.    . fluticasone (FLONASE) 50 MCG/ACT nasal spray Place 1 spray into both nostrils daily. 16 g 2  . fluticasone furoate-vilanterol (BREO ELLIPTA) 100-25 MCG/INH AEPB Inhale 1 puff into the lungs daily. 1 each 6  . hydrochlorothiazide (HYDRODIURIL) 25 MG tablet Take 1 tablet (25 mg total) by mouth daily. 90 tablet 3  . ipratropium-albuterol (DUONEB) 0.5-2.5 (3) MG/3ML SOLN Take 3 mLs by nebulization every 6 (six) hours as needed. 360 mL 1  . metFORMIN (GLUCOPHAGE) 500 MG tablet Take 2 tablets (1,000 mg total) by mouth daily with breakfast. 180 tablet 1  . montelukast (SINGULAIR) 10 MG tablet Take 1 tablet (10 mg total) by mouth at bedtime. 90 tablet 3  . omeprazole (PRILOSEC) 20 MG capsule Take 1 capsule (20 mg total) by mouth daily. 30 capsule 4  . potassium chloride SA (K-DUR,KLOR-CON) 20 MEQ tablet Take 1 tablet (20 mEq total) by mouth daily. 90 tablet 3  . predniSONE (DELTASONE) 10 MG tablet 3 pills daily for 2 days, 2 pills daily for 2 days, 1 pill daily for 2 days 20 tablet 0  . simvastatin (ZOCOR) 10 MG tablet Take 1 tablet (10 mg total) by mouth daily. 90 tablet 3   No current facility-administered medications on file prior to visit.     Review of Systems:  As per HPI- otherwise negative. No fever chills  Physical Examination: There were no vitals filed for this visit. There were no vitals filed for this visit. There is no height or  weight on file to calculate BMI. Ideal Body Weight:   Home blood pressure; she does have a home cuff but is not using this; she can start  Home glucose test; she does not do this  Controlled type 2 diabetes mellitus without complication, without long-term current use of insulin ([place]) - Plan: metFORMIN (  GLUCOPHAGE) 1000 MG tablet  Dyslipidemia  Essential hypertension  Moderate persistent asthma without complication   Had a very pleasant visit today, as always.  I have refilled her metformin, most recent A1c looked fine. She is tolerating simvastatin which is great news. Memory Dance is working well for her asthma symptoms.  We discussed how she might go back seeking patient assistance as this medication is difficult to afford.  She will let me know if I can help her at all in this regard I have asked her to start checking her blood pressure at home once or twice a week We plan to have her in for a visit in May or June, as the current coverage situation allows  Assessment and Plan: Controlled type 2 diabetes mellitus without complication, without long-term current use of insulin ([place]) - Plan: metFORMIN (GLUCOPHAGE) 1000 MG tablet  Dyslipidemia  Essential hypertension  Moderate persistent asthma without complication   Had a very pleasant visit today, as always.  I have refilled her metformin, most recent A1c looked fine. She is tolerating simvastatin which is great news. Memory Dance is working well for her asthma symptoms.  We discussed how she might go about seeking patient assistance as this medication is difficult to afford.  She will let me know if I can help her at all in this regard.  I attempted to do this now, the [place] has blocked the [place] patient assistance page on my Internet browser I have asked her to start checking her blood pressure at home once or twice a week We plan to have her in for a visit in May or June, as the current coverage situation allows  I have updated her health  maintenance, as she is status post total hysterectomy for benign disease and no longer needs Pap Message to patient via MyChart with a visit summary  Signed Lamar Blinks, MD

## 2018-10-06 NOTE — Patient Instructions (Addendum)
It was great to talk with you today as always.  Continues to stay safe Please see me in the office in about 2 months, as the pandemic situation allows Please have an eye exam done when possible Please look up "Breo patient assistance" online, they likely do have a program to help you pay for this medication I am glad that you been able to tolerate simvastatin.  We will plan to check your cholesterol the next time we visit I change her metformin to 1000 mg, she can just take 1 pill instead of 2 Please do start checking your blood pressure once or twice a week.  We would like to have you 135/85 or less, on average.  Take care, I will forward to seeing you soon.  Let me know if you need anything else in the meantime

## 2018-10-07 ENCOUNTER — Other Ambulatory Visit: Payer: Self-pay

## 2018-10-07 ENCOUNTER — Encounter: Payer: Self-pay | Admitting: Family Medicine

## 2018-10-07 ENCOUNTER — Ambulatory Visit (INDEPENDENT_AMBULATORY_CARE_PROVIDER_SITE_OTHER): Payer: Self-pay | Admitting: Family Medicine

## 2018-10-07 DIAGNOSIS — E785 Hyperlipidemia, unspecified: Secondary | ICD-10-CM

## 2018-10-07 DIAGNOSIS — E119 Type 2 diabetes mellitus without complications: Secondary | ICD-10-CM

## 2018-10-07 DIAGNOSIS — J4540 Moderate persistent asthma, uncomplicated: Secondary | ICD-10-CM

## 2018-10-07 DIAGNOSIS — I10 Essential (primary) hypertension: Secondary | ICD-10-CM

## 2018-10-07 MED ORDER — METFORMIN HCL 1000 MG PO TABS: 1000.0000 mg | ORAL_TABLET | Freq: Every day | ORAL | 3 refills | Status: DC

## 2018-10-08 NOTE — Telephone Encounter (Signed)
Paperwork is not in [employer]. Kelli does not have it nor does VS per Kelli. Called patient to reinitiate the process. Went to [employer], so I left a message for her to call us back.

## 2018-10-13 NOTE — Telephone Encounter (Signed)
LVM today for pt to return call, please see below messages. X1

## 2018-10-15 NOTE — Telephone Encounter (Signed)
LVM today for pt to return call, please see below messages. X2

## 2018-10-20 NOTE — Telephone Encounter (Signed)
Called and spoke with pt today Advised that we did not get the mailed copy of the forms at this time Reprinting the Breo 200 pt asst forms today Mailing them out to patient She will completed them again and hand delivery when done Closing message at this time Pt expressed understanding and verbalized understanding Nothing further needed

## 2018-11-17 NOTE — Telephone Encounter (Signed)
Patient came in office today, rec'd pt asst forms for Memory Dance  Faxed docs to [facility] to fax [phone] from triage fax Will f/u with [place] at phone [phone] this week make sure they have rec'd the application

## 2018-11-25 MED ORDER — FLUTICASONE FUROATE-VILANTEROL 100-25 MCG/INH IN AEPB: 1.0000 | INHALATION_SPRAY | Freq: Every day | RESPIRATORY_TRACT | 3 refills | Status: DC

## 2018-11-25 MED ORDER — FLUTICASONE FUROATE-VILANTEROL 200-25 MCG/INH IN AEPB: 1.0000 | INHALATION_SPRAY | Freq: Every day | RESPIRATORY_TRACT | 3 refills | Status: DC

## 2018-11-25 NOTE — Telephone Encounter (Signed)
Called [employer] at [phone] to check status update on pt's application for the Breo that was faxed 5/12. Spoke to [place] who stated that the application had been received but they were missing the page that was to be signed by pt as well as an actual Rx for the Breo 200. Stated to [place] that we would get the missing info taken care of and would refax it to [place].

## 2018-11-25 NOTE — Addendum Note (Signed)
Addended by: Lorretta Harp on: 11/25/2018 11:18 AM   Modules accepted: Orders

## 2018-12-11 ENCOUNTER — Encounter: Payer: Self-pay | Admitting: Family Medicine

## 2018-12-14 ENCOUNTER — Ambulatory Visit: Payer: Self-pay | Admitting: Family Medicine

## 2018-12-15 NOTE — Progress Notes (Signed)
[place] at [facility] [address], [place], [place] [address] [phone]  Date:  12/17/2018   Name:  Gabriela Torres   DOB:  June 18, 1971   MRN:  [id number]  PCP:  Darreld Mclean, MD    Chief Complaint: Restless leg syndrome   History of Present Illness:  Gabriela Torres is a 48 y.o. very pleasant female patient who presents with the following:  In person visit today for concern of restless legs- she had contacted me via mychart about this issue last week She unfortunately lost her job and thought her health insurance during the pandemic.  She is looking at a couple of other opportunities, and hopes to be employed again soon History of HTN. DM, hyperlipidemia, asthma Eye exam: Due Pneumonia vaccine: We will plan a day when she has insurance again Lab Results  Component Value Date   HGBA1C 6.1 07/23/2018   lexapro hctz 25 metformin 1000 daily kdur 20 daily  zocor Coreg BID   She notes that she has gained some weight- doing some stress eating  Gabriela Torres notes sx of RLS for maybe 18 years; however we had never really discussed it as it had not been that bothersome.  She has never been treated for this before.  She notes it has gotten worse over the last several weeks, she is not sure why it might have worsened She notes an intense urge to move her legs when she is in bed at night.  This can occur in both legs but most often in the left.   Patient Active Problem List   Diagnosis Date Noted  . Asthma 08/05/2018  . Morbid obesity with BMI of 40.0-44.9, adult ([place]) 08/05/2018  . Cough 08/05/2018  . Diabetes mellitus type 2, controlled, without complications ([place]) 18/84/1660  . Migraine 04/17/2013  . HTN (hypertension) 05/18/2012  . Hyperlipidemia 05/18/2012  . Ventral hernia 09/03/2011    Past Medical History:  Diagnosis Date  . Abdominal pain   . Abdominal wall hernia   . Anxiety   . Hearing loss   . Hyperlipidemia    . Hypertension   . Migraines     Past Surgical History:  Procedure Laterality Date  . ABDOMINAL HYSTERECTOMY  09/2003 or 09/2004    total   . HERNIA REPAIR    . NASAL TURBINATE REDUCTION  2000  . TONSILLECTOMY  2008  . VENTRAL HERNIA REPAIR  11/01/2011   Procedure: LAPAROSCOPIC VENTRAL HERNIA;  Surgeon: Merrie Roof, MD;  Location: [facility];  Service: General;  Laterality: N/A;    Social History   Tobacco Use  . Smoking status: Former Smoker    Packs/day: 0.25    Years: 1.00    Pack years: 0.25    Types: Cigarettes    Quit date: 10/24/1981    Years since quitting: 37.1  . Smokeless tobacco: Never Used  . Tobacco comment: social drinker  Substance Use Topics  . Alcohol use: Yes    Comment: 3 - 4 per week  . Drug use: No    Family History  Problem Relation Age of Onset  . Cancer Mother        abdominal  . Cancer Father        colon  . Heart disease Sister        chf    Allergies  Allergen Reactions  . Statins Other (See Comments)    Left lower and rear  flank pain.  . Lisinopril     Angioedema   . Norvasc [Amlodipine] Other (See Comments)    Gum hyperplasia  . Food Nausea Only    Dill, rye  . Guaifenesin & Derivatives Nausea And Vomiting    Medication list has been reviewed and updated.  Current Outpatient Medications on File Prior to Visit  Medication Sig Dispense Refill  . albuterol (PROVENTIL HFA;VENTOLIN HFA) 108 (90 Base) MCG/ACT inhaler Inhale 2 puffs into the lungs every 6 (six) hours as needed for wheezing or shortness of breath. 1 Inhaler 3  . ALPRAZolam (XANAX) 1 MG tablet Take 1 mg by mouth 3 (three) times daily as needed for anxiety.    Marland Kitchen amphetamine-dextroamphetamine (ADDERALL) 20 MG tablet Take 20 mg by mouth daily.    . carvedilol (COREG) 6.25 MG tablet Take 1 tablet (6.25 mg total) by mouth 2 (two) times daily. 180 tablet 3  . DiphenhydrAMINE HCl (BENADRYL ALLERGY PO) Take 25 mg by mouth. Reported on 08/16/2015    . EPINEPHrine 0.3 mg/0.3 mL  IJ SOAJ injection Inject 0.3 mLs (0.3 mg total) into the muscle once. 1 Device 2  . escitalopram (LEXAPRO) 20 MG tablet Take 20 mg by mouth daily.    . fluticasone (FLONASE) 50 MCG/ACT nasal spray Place 1 spray into both nostrils daily. 16 g 2  . fluticasone furoate-vilanterol (BREO ELLIPTA) 100-25 MCG/INH AEPB Inhale 1 puff into the lungs daily. 1 each 6  . fluticasone furoate-vilanterol (BREO ELLIPTA) 100-25 MCG/INH AEPB Inhale 1 puff into the lungs daily. 90 each 3  . hydrochlorothiazide (HYDRODIURIL) 25 MG tablet Take 1 tablet (25 mg total) by mouth daily. 90 tablet 3  . ipratropium-albuterol (DUONEB) 0.5-2.5 (3) MG/3ML SOLN Take 3 mLs by nebulization every 6 (six) hours as needed. 360 mL 1  . metFORMIN (GLUCOPHAGE) 1000 MG tablet Take 1 tablet (1,000 mg total) by mouth daily with breakfast. 90 tablet 3  . montelukast (SINGULAIR) 10 MG tablet Take 1 tablet (10 mg total) by mouth at bedtime. 90 tablet 3  . potassium chloride SA (K-DUR,KLOR-CON) 20 MEQ tablet Take 1 tablet (20 mEq total) by mouth daily. 90 tablet 3  . simvastatin (ZOCOR) 10 MG tablet Take 1 tablet (10 mg total) by mouth daily. 90 tablet 3   No current facility-administered medications on file prior to visit.     Review of Systems:  As per HPI- otherwise negative. No CP, no unusual SOB, no headaches  Physical Examination: Vitals:   12/17/18 1003  BP: (!) 160/118  Pulse: 83  Resp: 16  Temp: 98.4 F (36.9 C)  SpO2: 93%   Vitals:   12/17/18 1003  Weight: 292 lb (132.5 kg)   Body mass index is 48.59 kg/m. Ideal Body Weight:    GEN: WDWN, NAD, Non-toxic, A & O x 3, looks well, obese  HEENT: Atraumatic, Normocephalic. Neck supple. No masses, No LAD. Ears and Nose: No external deformity. CV: RRR, No M/G/R. No JVD. No thrill. No extra heart sounds. PULM: CTA B, no wheezes, crackles, rhonchi. No retractions. No resp. distress. No accessory muscle use. ABD: S, NT, ND EXTR: No c/c.  Minimal swelling at the sock line  bilaterally.  No tenderness or cords in either leg NEURO Normal gait.  PSYCH: Normally interactive. Conversant. Not depressed or anxious appearing.  Calm demeanor.  Foot exam: normal except pulses are difficult to palpate BP Readings from Last 3 Encounters:  12/17/18 (!) 160/118  08/05/18 118/78  07/31/18 (!) 158/78  Wt Readings from Last 3 Encounters:  12/17/18 292 lb (132.5 kg)  08/05/18 253 lb 9.6 oz (115 kg)  07/31/18 253 lb 6.4 oz (114.9 kg)   Pulse Readings from Last 3 Encounters:  12/17/18 83  08/05/18 81  07/31/18 85    Assessment and Plan: Restless legs - Plan: Ferritin, CBC  Dyslipidemia - Plan: Lipid panel  Essential hypertension  Controlled type 2 diabetes mellitus without complication, without long-term current use of insulin (HCC) - Plan: Basic metabolic panel, Hemoglobin A1c  In person follow-up visit today for concern of restless legs.  I did notice some macrocytosis in the past, wonder if she may have iron deficiency.  We will check a ferritin level and CBC today If she does not have iron deficiency, we will have her try Requip -Would like to go ahead and do routine labs, as I did offer to wait until she has health insurance again.  We will check an A1c, lipid panel, basic metabolic. We have had some issues with hypokalemia in the past.  If her potassium looks okay, we could consider as needed Lasix for her lower extremity edema  Noted that her blood pressure is high.  She will double her carvedilol for a total of 12.5 mg twice a day.  She will check her blood pressure and pulse at home, and let me know how she responds to this change  Follow-up: No follow-ups on file.  No orders of the defined types were placed in this encounter.  Orders Placed This Encounter  Procedures  . Basic metabolic panel  . Ferritin  . CBC  . Hemoglobin A1c  . Lipid panel     Signed Lamar Blinks, MD  Received her labs as follows, message to pt   Results for orders  placed or performed in visit on 64/33/29  Basic metabolic panel  Result Value Ref Range   Sodium 142 135 - 145 mEq/L   Potassium 4.0 3.5 - 5.1 mEq/L   Chloride 104 96 - 112 mEq/L   CO2 28 19 - 32 mEq/L   Glucose, Bld 87 70 - 99 mg/dL   BUN 23 6 - 23 mg/dL   Creatinine, Ser 0.71 0.40 - 1.20 mg/dL   Calcium 9.4 8.4 - 10.5 mg/dL   GFR 105.75 >60.00 mL/min  Ferritin  Result Value Ref Range   Ferritin 115.5 10.0 - 291.0 ng/mL  CBC  Result Value Ref Range   WBC 6.9 4.0 - 10.5 K/uL   RBC 4.94 3.87 - 5.11 Mil/uL   Platelets 226.0 150.0 - 400.0 K/uL   Hemoglobin 12.3 12.0 - 15.0 g/dL   HCT 37.8 36.0 - 46.0 %   MCV 76.5 (L) 78.0 - 100.0 fl   MCHC 32.6 30.0 - 36.0 g/dL   RDW 16.3 (H) 11.5 - 15.5 %  Hemoglobin A1c  Result Value Ref Range   Hgb A1c MFr Bld 6.4 4.6 - 6.5 %  Lipid panel  Result Value Ref Range   Cholesterol 216 (H) 0 - 200 mg/dL   Triglycerides 117.0 0.0 - 149.0 mg/dL   HDL 49.40 >39.00 mg/dL   VLDL 23.4 0.0 - 40.0 mg/dL   LDL Cholesterol 144 (H) 0 - 99 mg/dL   Total CHOL/HDL Ratio 4    NonHDL 167.03

## 2018-12-17 ENCOUNTER — Encounter: Payer: Self-pay | Admitting: Family Medicine

## 2018-12-17 ENCOUNTER — Other Ambulatory Visit: Payer: Self-pay

## 2018-12-17 ENCOUNTER — Ambulatory Visit (INDEPENDENT_AMBULATORY_CARE_PROVIDER_SITE_OTHER): Payer: Self-pay | Admitting: Family Medicine

## 2018-12-17 VITALS — BP 160/118 | HR 83 | Temp 98.4°F | Resp 16 | Wt 292.0 lb

## 2018-12-17 DIAGNOSIS — G2581 Restless legs syndrome: Secondary | ICD-10-CM

## 2018-12-17 DIAGNOSIS — E119 Type 2 diabetes mellitus without complications: Secondary | ICD-10-CM

## 2018-12-17 DIAGNOSIS — R600 Localized edema: Secondary | ICD-10-CM

## 2018-12-17 DIAGNOSIS — E785 Hyperlipidemia, unspecified: Secondary | ICD-10-CM

## 2018-12-17 DIAGNOSIS — I10 Essential (primary) hypertension: Secondary | ICD-10-CM

## 2018-12-17 LAB — LIPID PANEL
Cholesterol: 216 mg/dL — ABNORMAL HIGH (ref 0–200)
HDL: 49.4 mg/dL (ref >39.00)
LDL Cholesterol: 144 mg/dL — ABNORMAL HIGH (ref 0–99)
NonHDL: 167.03
Total CHOL/HDL Ratio: 4
Triglycerides: 117 mg/dL (ref 0.0–149.0)
VLDL: 23.4 mg/dL (ref 0.0–40.0)

## 2018-12-17 LAB — BASIC METABOLIC PANEL
BUN: 23 mg/dL (ref 6–23)
CO2: 28 mEq/L (ref 19–32)
Calcium: 9.4 mg/dL (ref 8.4–10.5)
Chloride: 104 mEq/L (ref 96–112)
Creatinine, Ser: 0.71 mg/dL (ref 0.40–1.20)
GFR: 105.75 mL/min (ref >60.00)
Glucose, Bld: 87 mg/dL (ref 70–99)
Potassium: 4 mEq/L (ref 3.5–5.1)
Sodium: 142 mEq/L (ref 135–145)

## 2018-12-17 LAB — FERRITIN: Ferritin: 115.5 ng/mL (ref 10.0–291.0)

## 2018-12-17 LAB — CBC
HCT: 37.8 % (ref 36.0–46.0)
Hemoglobin: 12.3 g/dL (ref 12.0–15.0)
MCHC: 32.6 g/dL (ref 30.0–36.0)
MCV: 76.5 fl — ABNORMAL LOW (ref 78.0–100.0)
Platelets: 226 10*3/uL (ref 150.0–400.0)
RBC: 4.94 Mil/uL (ref 3.87–5.11)
RDW: 16.3 % — ABNORMAL HIGH (ref 11.5–15.5)
WBC: 6.9 10*3/uL (ref 4.0–10.5)

## 2018-12-17 LAB — HEMOGLOBIN A1C: Hgb A1c MFr Bld: 6.4 % (ref 4.6–6.5)

## 2018-12-17 MED ORDER — ROPINIROLE HCL 0.25 MG PO TABS: 0.2500 mg | ORAL_TABLET | Freq: Every day | ORAL | 5 refills | Status: DC

## 2018-12-17 NOTE — Patient Instructions (Addendum)
It was great to see you in the office today.  I will be in touch with your labs ASAP.  Your restless legs may be related to iron deficiency, if this is the case treating the iron deficiency and may resolve your symptoms.  However, if your iron is normal I would suggest a medication called Requip for treatment.  We can also potentially use some furosemide as needed for swelling.  Let me just make sure your electrolytes and kidney function are okay first  Once you have insurance again, I would suggest that we do a pneumonia vaccine, and that you catch up on your eye exam if needed  Let's try doubling your coreg dose for your BP- please let me know how your BP and pulse respond to this change

## 2018-12-18 ENCOUNTER — Encounter: Payer: Self-pay | Admitting: Family Medicine

## 2018-12-18 DIAGNOSIS — I10 Essential (primary) hypertension: Secondary | ICD-10-CM

## 2018-12-18 MED ORDER — FUROSEMIDE 20 MG PO TABS: ORAL_TABLET | ORAL | 3 refills | Status: DC

## 2018-12-19 ENCOUNTER — Encounter: Payer: Self-pay | Admitting: Family Medicine

## 2018-12-21 ENCOUNTER — Encounter: Payer: Self-pay | Admitting: Family Medicine

## 2018-12-21 MED ORDER — CLONIDINE 0.1 MG/24HR TD PTWK: 0.1000 mg | MEDICATED_PATCH | TRANSDERMAL | 3 refills | Status: DC

## 2018-12-22 ENCOUNTER — Encounter: Payer: Self-pay | Admitting: Family Medicine

## 2018-12-22 DIAGNOSIS — I10 Essential (primary) hypertension: Secondary | ICD-10-CM

## 2018-12-23 ENCOUNTER — Encounter: Payer: Self-pay | Admitting: Family Medicine

## 2018-12-23 MED ORDER — CLONIDINE HCL 0.1 MG PO TABS: 0.1000 mg | ORAL_TABLET | Freq: Two times a day (BID) | ORAL | 5 refills | Status: DC

## 2018-12-24 ENCOUNTER — Encounter: Payer: Self-pay | Admitting: Family Medicine

## 2018-12-25 ENCOUNTER — Encounter: Payer: Self-pay | Admitting: Family Medicine

## 2018-12-26 ENCOUNTER — Encounter: Payer: Self-pay | Admitting: Family Medicine

## 2018-12-27 ENCOUNTER — Encounter: Payer: Self-pay | Admitting: Family Medicine

## 2018-12-28 ENCOUNTER — Encounter: Payer: Self-pay | Admitting: Family Medicine

## 2018-12-28 DIAGNOSIS — Z5181 Encounter for therapeutic drug level monitoring: Secondary | ICD-10-CM

## 2018-12-29 ENCOUNTER — Encounter: Payer: Self-pay | Admitting: Family Medicine

## 2018-12-29 NOTE — Telephone Encounter (Signed)
Called patient to find out more about her symptoms.  She denies any chest pain or shortness of breath.  She does not have any unilateral weakness, but just feels like she is a little sore and tired in her muscles all over.  She has a lab appointment this coming Thursday.  She states she feels comfortable waiting until Thursday, will let me know if any change or worsening her symptoms in the meantime

## 2018-12-30 ENCOUNTER — Encounter: Payer: Self-pay | Admitting: Family Medicine

## 2018-12-30 ENCOUNTER — Telehealth: Payer: Self-pay

## 2018-12-30 DIAGNOSIS — M791 Myalgia, unspecified site: Secondary | ICD-10-CM

## 2018-12-30 DIAGNOSIS — Z20822 Contact with and (suspected) exposure to covid-19: Secondary | ICD-10-CM

## 2018-12-30 NOTE — Telephone Encounter (Signed)
Patient spoke with provider virtually today. Per provider request patient needs to be tested for covid-19 due to exposure. Please set patient up to be tested.

## 2018-12-30 NOTE — Telephone Encounter (Signed)
lvm for pt to return call to schedule covid testing.

## 2018-12-31 ENCOUNTER — Other Ambulatory Visit (INDEPENDENT_AMBULATORY_CARE_PROVIDER_SITE_OTHER): Payer: Self-pay

## 2018-12-31 ENCOUNTER — Other Ambulatory Visit: Payer: Self-pay

## 2018-12-31 DIAGNOSIS — M791 Myalgia, unspecified site: Secondary | ICD-10-CM

## 2018-12-31 DIAGNOSIS — Z5181 Encounter for therapeutic drug level monitoring: Secondary | ICD-10-CM

## 2018-12-31 NOTE — Addendum Note (Signed)
Addended by: Kendrick Ranch on: 12/31/2018 10:12 AM   Modules accepted: Orders

## 2018-12-31 NOTE — Telephone Encounter (Addendum)
2nd Attempted to call patient to schedule COVID screening. / Left VM to call to schedule if needed. / I will place the order in case patient calls back. /

## 2019-01-01 ENCOUNTER — Other Ambulatory Visit: Payer: Self-pay | Admitting: Critical Care Medicine

## 2019-01-01 ENCOUNTER — Encounter: Payer: Self-pay | Admitting: Family Medicine

## 2019-01-01 LAB — BASIC METABOLIC PANEL
BUN: 23 mg/dL (ref 6–23)
CO2: 30 mEq/L (ref 19–32)
Calcium: 9.1 mg/dL (ref 8.4–10.5)
Chloride: 100 mEq/L (ref 96–112)
Creatinine, Ser: 0.63 mg/dL (ref 0.40–1.20)
GFR: 121.37 mL/min (ref >60.00)
Glucose, Bld: 72 mg/dL (ref 70–99)
Potassium: 4.2 mEq/L (ref 3.5–5.1)
Sodium: 138 mEq/L (ref 135–145)

## 2019-01-01 LAB — CK: Total CK: 81 U/L (ref 7–177)

## 2019-01-03 ENCOUNTER — Encounter: Payer: Self-pay | Admitting: Family Medicine

## 2019-01-04 ENCOUNTER — Encounter: Payer: Self-pay | Admitting: Family Medicine

## 2019-01-05 LAB — NOVEL CORONAVIRUS, NAA: SARS-CoV-2, NAA: NOT DETECTED

## 2019-01-06 ENCOUNTER — Ambulatory Visit: Payer: Self-pay | Admitting: Family Medicine

## 2019-01-06 NOTE — Progress Notes (Addendum)
[place] at [facility] [place], [place], [place] [address] [phone]  Date:  01/07/2019   Name:  Gabriela Torres   DOB:  1970-12-26   MRN:  [id number]  PCP:  Darreld Mclean, MD    Chief Complaint: muscle weakness   History of Present Illness:  Gabriela Torres is a 48 y.o. very pleasant female patient who presents with the following:  History of DM, HTN, hyperlipidemia, moderate persistent asthma We have recently been struggling to get her BP under control- she saw me in the office on 6/11 and we had her increase her dose of carvedilol, and then ended up adding oral clonidine as well as her BP was still concerningly high.  She had been on clonidine patch but it was too expensive   We also added requip for RLS She then had concern about muscle aches and generalized fatigue We did a BMP due to history of hypokalemia and go a CK- all ok  DM has been well controlled She continues to have variable control of her blood pressures - recent mychart message:  7:07 am 143/80 pulse 66  10:15am 189/87 pulse 63... before meds  1:45pm 133/66 pulse 68  7:42pm 148/67 pulse 85 Before nightly meds  11:39pm 164/88 pulse 78 Taken immediately before I took my nightly meds  Lab Results  Component Value Date   HGBA1C 6.4 12/17/2018   She notes that her muscle pain is getting better- she still has a bit of pain in her upper back She does take zocor but she has been on this for some time, so less likely to be related  albuerol prn Breo inhaler Carvedilol 12.5 BID Clonidine 0.1 BID lexapro 40 Lasix prn hctz 25 daily Potasium 20 meq daily Metformin 1000 BID zocor 10 requip- she does feel like this is helping her ; she is on 0.5 mg now and it has basically resolved her restless leg symptoms  No fever or chills    Patient Active Problem List   Diagnosis Date Noted  . Asthma 08/05/2018  . Morbid obesity with BMI of  40.0-44.9, adult ([place]) 08/05/2018  . Cough 08/05/2018  . Diabetes mellitus type 2, controlled, without complications ([place]) 03/54/6568  . Migraine 04/17/2013  . HTN (hypertension) 05/18/2012  . Hyperlipidemia 05/18/2012  . Ventral hernia 09/03/2011    Past Medical History:  Diagnosis Date  . Abdominal pain   . Abdominal wall hernia   . Anxiety   . Hearing loss   . Hyperlipidemia   . Hypertension   . Migraines     Past Surgical History:  Procedure Laterality Date  . ABDOMINAL HYSTERECTOMY  09/2003 or 09/2004    total   . HERNIA REPAIR    . NASAL TURBINATE REDUCTION  2000  . TONSILLECTOMY  2008  . VENTRAL HERNIA REPAIR  11/01/2011   Procedure: LAPAROSCOPIC VENTRAL HERNIA;  Surgeon: Merrie Roof, MD;  Location: [facility];  Service: General;  Laterality: N/A;    Social History   Tobacco Use  . Smoking status: Former Smoker    Packs/day: 0.25    Years: 1.00    Pack years: 0.25    Types: Cigarettes    Quit date: 10/24/1981    Years since quitting: 37.2  . Smokeless tobacco: Never Used  . Tobacco comment: social drinker  Substance Use Topics  . Alcohol use: Yes    Comment: 3 - 4 per week  .  Drug use: No    Family History  Problem Relation Age of Onset  . Cancer Mother        abdominal  . Cancer Father        colon  . Heart disease Sister        chf    Allergies  Allergen Reactions  . Statins Other (See Comments)    Left lower and rear flank pain.  . Lisinopril     Angioedema   . Norvasc [Amlodipine] Other (See Comments)    Gum hyperplasia  . Food Nausea Only    Dill, rye  . Guaifenesin & Derivatives Nausea And Vomiting    Medication list has been reviewed and updated.  Current Outpatient Medications on File Prior to Visit  Medication Sig Dispense Refill  . albuterol (PROVENTIL HFA;VENTOLIN HFA) 108 (90 Base) MCG/ACT inhaler Inhale 2 puffs into the lungs every 6 (six) hours as needed for wheezing or shortness of breath. 1 Inhaler 3  . ALPRAZolam (XANAX)  1 MG tablet Take 1 mg by mouth 3 (three) times daily as needed for anxiety.    Marland Kitchen amphetamine-dextroamphetamine (ADDERALL) 20 MG tablet Take 20 mg by mouth daily.    . carvedilol (COREG) 6.25 MG tablet Take 1 tablet (6.25 mg total) by mouth 2 (two) times daily. 180 tablet 3  . cloNIDine (CATAPRES) 0.1 MG tablet Take 1 tablet (0.1 mg total) by mouth 2 (two) times daily. 60 tablet 5  . DiphenhydrAMINE HCl (BENADRYL ALLERGY PO) Take 25 mg by mouth. Reported on 08/16/2015    . EPINEPHrine 0.3 mg/0.3 mL IJ SOAJ injection Inject 0.3 mLs (0.3 mg total) into the muscle once. 1 Device 2  . escitalopram (LEXAPRO) 20 MG tablet Take 20 mg by mouth daily.    . fluticasone (FLONASE) 50 MCG/ACT nasal spray Place 1 spray into both nostrils daily. 16 g 2  . fluticasone furoate-vilanterol (BREO ELLIPTA) 100-25 MCG/INH AEPB Inhale 1 puff into the lungs daily. 1 each 6  . fluticasone furoate-vilanterol (BREO ELLIPTA) 100-25 MCG/INH AEPB Inhale 1 puff into the lungs daily. 90 each 3  . furosemide (LASIX) 20 MG tablet Take 1 by mouth daily prn swelling 30 tablet 3  . hydrochlorothiazide (HYDRODIURIL) 25 MG tablet Take 1 tablet (25 mg total) by mouth daily. 90 tablet 3  . ipratropium-albuterol (DUONEB) 0.5-2.5 (3) MG/3ML SOLN Take 3 mLs by nebulization every 6 (six) hours as needed. 360 mL 1  . metFORMIN (GLUCOPHAGE) 1000 MG tablet Take 1 tablet (1,000 mg total) by mouth daily with breakfast. 90 tablet 3  . montelukast (SINGULAIR) 10 MG tablet Take 1 tablet (10 mg total) by mouth at bedtime. 90 tablet 3  . potassium chloride SA (K-DUR,KLOR-CON) 20 MEQ tablet Take 1 tablet (20 mEq total) by mouth daily. 90 tablet 3  . rOPINIRole (REQUIP) 0.25 MG tablet Take 1 tablet (0.25 mg total) by mouth at bedtime. Increase to 0.5 mg after 2 days 60 tablet 5  . simvastatin (ZOCOR) 10 MG tablet Take 1 tablet (10 mg total) by mouth daily. 90 tablet 3   No current facility-administered medications on file prior to visit.     Review of  Systems:  As per HPI- otherwise negative.   Physical Examination: Vitals:   01/07/19 0930  BP: 140/90  Pulse: 69  SpO2: 98%   Vitals:   01/07/19 0930  Weight: 288 lb (130.6 kg)  Height: 5\' 5"  (1.651 m)   Body mass index is 47.93 kg/m. Ideal Body Weight: Weight  in (lb) to have BMI = 25: 149.9  GEN: WDWN, NAD, Non-toxic, A & O x 3, obese, looks well HEENT: Atraumatic, Normocephalic. Neck supple. No masses, No LAD. Ears and Nose: No external deformity. CV: RRR, No M/G/R. No JVD. No thrill. No extra heart sounds. PULM: CTA B, no wheezes, crackles, rhonchi. No retractions. No resp. distress. No accessory muscle use. EXTR: No c/c/e NEURO Normal gait.  PSYCH: Normally interactive. Conversant. Not depressed or anxious appearing.  Calm demeanor.  She has tenderness in her upper back muscles bilaterally.  Normal strength, sensation, reflexes of both upper extremities.  Legs are currently nontender No pain with shoulder range of motion   Assessment and Plan:   ICD-10-CM   1. Muscle ache  M79.10 Comprehensive metabolic panel    Sedimentation rate  2. Essential hypertension  I10 carvedilol (COREG) 12.5 MG tablet  3. Restless legs  G25.81 rOPINIRole (REQUIP) 0.5 MG tablet   Today to recheck on blood pressure and discuss muscle aches.  He has noted pain in her muscles, generalized, for the last few weeks.  However she is actually now feeling much better.  A recent BMP and CK was normal, she would like to repeat a CMP today and also a sed rate.  We will be in touch with her pending her labs Blood pressure looks good, continue current regimen She will let me know if her symptoms return  Follow-up: No follow-ups on file.  Meds ordered this encounter  Medications  . carvedilol (COREG) 12.5 MG tablet    Sig: Take 1 tablet (12.5 mg total) by mouth 2 (two) times daily.    Dispense:  180 tablet    Refill:  3  . rOPINIRole (REQUIP) 0.5 MG tablet    Sig: Take 1 tablet (0.5 mg total) by  mouth at bedtime.    Dispense:  90 tablet    Refill:  3   Orders Placed This Encounter  Procedures  . Comprehensive metabolic panel  . Sedimentation rate      Signed Lamar Blinks, MD  Received her labs, message to pt  Results for orders placed or performed in visit on 01/07/19  Comprehensive metabolic panel  Result Value Ref Range   Sodium 140 135 - 145 mEq/L   Potassium 3.8 3.5 - 5.1 mEq/L   Chloride 102 96 - 112 mEq/L   CO2 31 19 - 32 mEq/L   Glucose, Bld 99 70 - 99 mg/dL   BUN 17 6 - 23 mg/dL   Creatinine, Ser 0.67 0.40 - 1.20 mg/dL   Total Bilirubin 0.6 0.2 - 1.2 mg/dL   Alkaline Phosphatase 75 39 - 117 U/L   AST 11 0 - 37 U/L   ALT 14 0 - 35 U/L   Total Protein 7.0 6.0 - 8.3 g/dL   Albumin 4.1 3.5 - 5.2 g/dL   Calcium 9.2 8.4 - 10.5 mg/dL   GFR 113.04 >60.00 mL/min  Sedimentation rate  Result Value Ref Range   Sed Rate 45 (H) 0 - 20 mm/hr

## 2019-01-07 ENCOUNTER — Other Ambulatory Visit: Payer: Self-pay

## 2019-01-07 ENCOUNTER — Encounter: Payer: Self-pay | Admitting: Family Medicine

## 2019-01-07 ENCOUNTER — Ambulatory Visit (INDEPENDENT_AMBULATORY_CARE_PROVIDER_SITE_OTHER): Payer: Self-pay | Admitting: Family Medicine

## 2019-01-07 VITALS — BP 140/90 | HR 69 | Ht 65.0 in | Wt 288.0 lb

## 2019-01-07 DIAGNOSIS — M791 Myalgia, unspecified site: Secondary | ICD-10-CM

## 2019-01-07 DIAGNOSIS — I10 Essential (primary) hypertension: Secondary | ICD-10-CM

## 2019-01-07 DIAGNOSIS — G2581 Restless legs syndrome: Secondary | ICD-10-CM

## 2019-01-07 LAB — COMPREHENSIVE METABOLIC PANEL
ALT: 14 U/L (ref 0–35)
AST: 11 U/L (ref 0–37)
Albumin: 4.1 g/dL (ref 3.5–5.2)
Alkaline Phosphatase: 75 U/L (ref 39–117)
BUN: 17 mg/dL (ref 6–23)
CO2: 31 mEq/L (ref 19–32)
Calcium: 9.2 mg/dL (ref 8.4–10.5)
Chloride: 102 mEq/L (ref 96–112)
Creatinine, Ser: 0.67 mg/dL (ref 0.40–1.20)
GFR: 113.04 mL/min (ref >60.00)
Glucose, Bld: 99 mg/dL (ref 70–99)
Potassium: 3.8 mEq/L (ref 3.5–5.1)
Sodium: 140 mEq/L (ref 135–145)
Total Bilirubin: 0.6 mg/dL (ref 0.2–1.2)
Total Protein: 7 g/dL (ref 6.0–8.3)

## 2019-01-07 LAB — SEDIMENTATION RATE: Sed Rate: 45 mm/hr — ABNORMAL HIGH (ref 0–20)

## 2019-01-07 MED ORDER — ROPINIROLE HCL 0.5 MG PO TABS: 0.5000 mg | ORAL_TABLET | Freq: Every day | ORAL | 3 refills | Status: DC

## 2019-01-07 MED ORDER — CARVEDILOL 12.5 MG PO TABS: 12.5000 mg | ORAL_TABLET | Freq: Two times a day (BID) | ORAL | 3 refills | Status: DC

## 2019-01-07 NOTE — Patient Instructions (Signed)
Great to see you again today!   I think we have found the right recipe for your BP control I will be in touch with your labs asap  Assuming you are feeling well I think we can then plan to visit in 4-6 months

## 2019-01-08 ENCOUNTER — Encounter: Payer: Self-pay | Admitting: Family Medicine

## 2019-03-22 ENCOUNTER — Other Ambulatory Visit: Payer: Self-pay

## 2019-04-25 ENCOUNTER — Other Ambulatory Visit: Payer: Self-pay | Admitting: Family Medicine

## 2019-04-25 DIAGNOSIS — I10 Essential (primary) hypertension: Secondary | ICD-10-CM

## 2019-05-12 ENCOUNTER — Encounter: Payer: Self-pay | Admitting: Family Medicine

## 2019-05-21 ENCOUNTER — Other Ambulatory Visit: Payer: Self-pay | Admitting: Family Medicine

## 2019-05-21 DIAGNOSIS — G2581 Restless legs syndrome: Secondary | ICD-10-CM

## 2019-05-23 NOTE — Progress Notes (Deleted)
[place] at [facility] [address], [place], [place] [address] [phone]  Date:  05/26/2019   Name:  Gabriela Torres   DOB:  06/10/1971   MRN:  [id number]  PCP:  Darreld Mclean, MD    Chief Complaint: No chief complaint on file.   History of Present Illness:  Gabriela Torres is a 48 y.o. very pleasant female patient who presents with the following:  Here today for follow-up visit and labs History of obesity, asthma, hypertension, hyperlipidemia, diabetes  Lab Results  Component Value Date   HGBA1C 6.4 12/17/2018   We had a chance to get her blood pressure under control over the summer, and I also saw her for some generalized muscle aches and fatigue Her sed rate at that time was slightly elevated at 45-can repeat her today  Flu vaccine  Patient Active Problem List   Diagnosis Date Noted  . Asthma 08/05/2018  . Morbid obesity with BMI of 40.0-44.9, adult ([place]) 08/05/2018  . Cough 08/05/2018  . Diabetes mellitus type 2, controlled, without complications ([place]) Q000111Q  . Migraine 04/17/2013  . HTN (hypertension) 05/18/2012  . Hyperlipidemia 05/18/2012  . Ventral hernia 09/03/2011    Past Medical History:  Diagnosis Date  . Abdominal pain   . Abdominal wall hernia   . Anxiety   . Hearing loss   . Hyperlipidemia   . Hypertension   . Migraines     Past Surgical History:  Procedure Laterality Date  . ABDOMINAL HYSTERECTOMY  09/2003 or 09/2004    total   . HERNIA REPAIR    . NASAL TURBINATE REDUCTION  2000  . TONSILLECTOMY  2008  . VENTRAL HERNIA REPAIR  11/01/2011   Procedure: LAPAROSCOPIC VENTRAL HERNIA;  Surgeon: Merrie Roof, MD;  Location: [facility];  Service: General;  Laterality: N/A;    Social History   Tobacco Use  . Smoking status: Former Smoker    Packs/day: 0.25    Years: 1.00    Pack years: 0.25    Types: Cigarettes    Quit date: 10/24/1981    Years since quitting: 37.6  . Smokeless  tobacco: Never Used  . Tobacco comment: social drinker  Substance Use Topics  . Alcohol use: Yes    Comment: 3 - 4 per week  . Drug use: No    Family History  Problem Relation Age of Onset  . Cancer Mother        abdominal  . Cancer Father        colon  . Heart disease Sister        chf    Allergies  Allergen Reactions  . Statins Other (See Comments)    Left lower and rear flank pain.  . Lisinopril     Angioedema   . Norvasc [Amlodipine] Other (See Comments)    Gum hyperplasia  . Food Nausea Only    Dill, rye  . Guaifenesin & Derivatives Nausea And Vomiting    Medication list has been reviewed and updated.  Current Outpatient Medications on File Prior to Visit  Medication Sig Dispense Refill  . albuterol (PROVENTIL HFA;VENTOLIN HFA) 108 (90 Base) MCG/ACT inhaler Inhale 2 puffs into the lungs every 6 (six) hours as needed for wheezing or shortness of breath. 1 Inhaler 3  . amphetamine-dextroamphetamine (ADDERALL) 20 MG tablet Take 20 mg by mouth daily.    . carvedilol (COREG) 12.5 MG tablet Take 1 tablet (  12.5 mg total) by mouth 2 (two) times daily. 180 tablet 3  . cloNIDine (CATAPRES) 0.1 MG tablet TAKE ONE TABLET BY MOUTH TWICE A DAY 180 tablet 1  . DiphenhydrAMINE HCl (BENADRYL ALLERGY PO) Take 25 mg by mouth. Reported on 08/16/2015    . EPINEPHrine 0.3 mg/0.3 mL IJ SOAJ injection Inject 0.3 mLs (0.3 mg total) into the muscle once. 1 Device 2  . escitalopram (LEXAPRO) 20 MG tablet Take 20 mg by mouth daily.    . fluticasone (FLONASE) 50 MCG/ACT nasal spray Place 1 spray into both nostrils daily. 16 g 2  . fluticasone furoate-vilanterol (BREO ELLIPTA) 100-25 MCG/INH AEPB Inhale 1 puff into the lungs daily. 90 each 3  . furosemide (LASIX) 20 MG tablet Take 1 by mouth daily prn swelling 30 tablet 3  . hydrochlorothiazide (HYDRODIURIL) 25 MG tablet Take 1 tablet (25 mg total) by mouth daily. 90 tablet 3  . ipratropium-albuterol (DUONEB) 0.5-2.5 (3) MG/3ML SOLN Take 3 mLs by  nebulization every 6 (six) hours as needed. 360 mL 1  . metFORMIN (GLUCOPHAGE) 1000 MG tablet Take 1 tablet (1,000 mg total) by mouth daily with breakfast. 90 tablet 3  . montelukast (SINGULAIR) 10 MG tablet Take 1 tablet (10 mg total) by mouth at bedtime. 90 tablet 3  . potassium chloride SA (K-DUR,KLOR-CON) 20 MEQ tablet Take 1 tablet (20 mEq total) by mouth daily. 90 tablet 3  . rOPINIRole (REQUIP) 0.5 MG tablet Take 1 tablet (0.5 mg total) by mouth at bedtime. 90 tablet 3  . simvastatin (ZOCOR) 10 MG tablet Take 1 tablet (10 mg total) by mouth daily. 90 tablet 3   No current facility-administered medications on file prior to visit.     Review of Systems:  ***  Physical Examination: There were no vitals filed for this visit. There were no vitals filed for this visit. There is no height or weight on file to calculate BMI. Ideal Body Weight:    ***  Assessment and Plan: ***  Signed Lamar Blinks, MD

## 2019-05-26 ENCOUNTER — Encounter: Payer: Self-pay | Admitting: Family Medicine

## 2019-05-26 ENCOUNTER — Ambulatory Visit: Payer: Self-pay | Admitting: Family Medicine

## 2019-06-02 ENCOUNTER — Encounter: Payer: Self-pay | Admitting: Family Medicine

## 2019-06-06 NOTE — Progress Notes (Addendum)
[place] at [facility], [place], [place] [address] [phone]  Date:  06/09/2019   Name:  Gabriela Torres   DOB:  12/14/1970   MRN:  [id number]  PCP:  Darreld Mclean, MD    Chief Complaint: Diabetes   History of Present Illness:  Gabriela Torres is a 48 y.o. 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  This is going fine, but she actually will be transferring to a new internal medicine doctor fairly soon  Flu vaccine- done on 04/08/2019 Pneumonia vaccine- give today Eye exam- she plans to do this soon  She is overall feeling pretty well She has OA in both her knees about 10 days ago- she has seen ortho and did x-rays but no MRI as of yet  They did a steroid injection and mobic for one month  The steroid injection helped some   She has noted a mole on her left leg for years- it bothers her cosmetically It does not seem to be getting larger necessarily, but perhaps harder  She wonders about having this removed  She also has a couple of small warts, 1 on each hand.  She would like to have these frozen if possible  She checks her blood pressure at work on occasion, notes her systolic numbers are typically 120-130 Went over medications today She is taking her potassium supplement  Lab Results  Component Value Date   HGBA1C 6.4 12/17/2018   Carvedilol Clonidine Lasix 20 as needed HCTZ 25 Metformin at 1000 mg once daily Potassium 20 mEq once daily Simvastatin 10  Patient Active Problem List   Diagnosis Date Noted  . Asthma 08/05/2018  . Morbid obesity with BMI of 40.0-44.9, adult ([place]) 08/05/2018  . Cough 08/05/2018  . Diabetes mellitus type 2, controlled, without  complications ([place]) Q000111Q  . Migraine 04/17/2013  . HTN (hypertension) 05/18/2012  . Hyperlipidemia 05/18/2012  . Ventral hernia 09/03/2011    Past Medical History:  Diagnosis Date  . Abdominal pain   . Abdominal wall hernia   . Anxiety   . Hearing loss   . Hyperlipidemia   . Hypertension   . Migraines     Past Surgical History:  Procedure Laterality Date  . ABDOMINAL HYSTERECTOMY  09/2003 or 09/2004    total   . HERNIA REPAIR    . NASAL TURBINATE REDUCTION  2000  . TONSILLECTOMY  2008  . VENTRAL HERNIA REPAIR  11/01/2011   Procedure: LAPAROSCOPIC VENTRAL HERNIA;  Surgeon: Merrie Roof, MD;  Location: [facility];  Service: General;  Laterality: N/A;    Social History   Tobacco Use  . Smoking status: Former Smoker    Packs/day: 0.25    Years: 1.00    Pack years: 0.25    Types: Cigarettes    Quit date: 10/24/1981    Years since quitting: 37.6  . Smokeless tobacco: Never Used  . Tobacco comment: social drinker  Substance Use Topics  . Alcohol use: Yes    Comment: 3 - 4 per week  . Drug use: No    Family History  Problem Relation Age of Onset  . Cancer Mother  abdominal  . Cancer Father        colon  . Heart disease Sister        chf    Allergies  Allergen Reactions  . Statins Other (See Comments)    Left lower and rear flank pain.  . Lisinopril     Angioedema   . Norvasc [Amlodipine] Other (See Comments)    Gum hyperplasia  . Food Nausea Only    Dill, rye  . Guaifenesin & Derivatives Nausea And Vomiting    Medication list has been reviewed and updated.  Current Outpatient Medications on File Prior to Visit  Medication Sig Dispense Refill  . albuterol (PROVENTIL HFA;VENTOLIN HFA) 108 (90 Base) MCG/ACT inhaler Inhale 2 puffs into the lungs every 6 (six) hours as needed for wheezing or shortness of breath. 1 Inhaler 3  . amphetamine-dextroamphetamine (ADDERALL) 20 MG tablet Take 20 mg by mouth daily.    . carvedilol (COREG) 12.5 MG tablet  Take 1 tablet (12.5 mg total) by mouth 2 (two) times daily. 180 tablet 3  . cloNIDine (CATAPRES) 0.1 MG tablet TAKE ONE TABLET BY MOUTH TWICE A DAY 180 tablet 1  . DiphenhydrAMINE HCl (BENADRYL ALLERGY PO) Take 25 mg by mouth. Reported on 08/16/2015    . EPINEPHrine 0.3 mg/0.3 mL IJ SOAJ injection Inject 0.3 mLs (0.3 mg total) into the muscle once. 1 Device 2  . escitalopram (LEXAPRO) 20 MG tablet Take 20 mg by mouth daily.    . fluticasone (FLONASE) 50 MCG/ACT nasal spray Place 1 spray into both nostrils daily. 16 g 2  . fluticasone furoate-vilanterol (BREO ELLIPTA) 100-25 MCG/INH AEPB Inhale 1 puff into the lungs daily. 90 each 3  . furosemide (LASIX) 20 MG tablet Take 1 by mouth daily prn swelling 30 tablet 3  . hydrochlorothiazide (HYDRODIURIL) 25 MG tablet Take 1 tablet (25 mg total) by mouth daily. 90 tablet 3  . ipratropium-albuterol (DUONEB) 0.5-2.5 (3) MG/3ML SOLN Take 3 mLs by nebulization every 6 (six) hours as needed. 360 mL 1  . metFORMIN (GLUCOPHAGE) 1000 MG tablet Take 1 tablet (1,000 mg total) by mouth daily with breakfast. 90 tablet 3  . montelukast (SINGULAIR) 10 MG tablet Take 1 tablet (10 mg total) by mouth at bedtime. 90 tablet 3  . potassium chloride SA (K-DUR,KLOR-CON) 20 MEQ tablet Take 1 tablet (20 mEq total) by mouth daily. 90 tablet 3  . rOPINIRole (REQUIP) 0.5 MG tablet TAKE ONE TABLET BY MOUTH AT BEDTIME (Patient taking differently: Take 1 mg by mouth 2 (two) times daily. Taking two tablets by mouth in morning and two tablets in evening) 240 tablet 1  . simvastatin (ZOCOR) 10 MG tablet Take 1 tablet (10 mg total) by mouth daily. 90 tablet 3   No current facility-administered medications on file prior to visit.     Review of Systems:  As per HPI- otherwise negative. No fever or chills  No chest pain or shortness of breath  Physical Examination: Vitals:   06/09/19 0815 06/09/19 0839  BP: (!) 136/96 112/78  Pulse: 85   Resp: 18   Temp: (!) 96.5 F (35.8 C)    SpO2: 98%    Vitals:   06/09/19 0815  Weight: 278 lb (126.1 kg)  Height: 5\' 5"  (1.651 m)   Body mass index is 46.26 kg/m. Ideal Body Weight: Weight in (lb) to have BMI = 25: 149.9  GEN: WDWN, NAD, Non-toxic, A & O x 3, obese, looks well HEENT: Atraumatic, Normocephalic. Neck supple. No  masses, No LAD.  TM within normal limits bilaterally Ears and Nose: No external deformity. CV: RRR, No M/G/R. No JVD. No thrill. No extra heart sounds. PULM: CTA B, no wheezes, crackles, rhonchi. No retractions. No resp. distress. No accessory muscle use. EXTR: No c/c/e NEURO Normal gait.  PSYCH: Normally interactive. Conversant. Not depressed or anxious appearing.  Calm demeanor.  Left posterior lower leg displays an approximately dime sized, hyperpigmented, scaly skin lesion.  This may represent a seborrheic keratosis She has 1 tiny wart on the index finger of each hand.  Verbal consent obtained.  Liquid nitrogen cryotherapy to each wart x3 cycles each.  Patient tolerated well, no complications  Assessment and Plan: Controlled type 2 diabetes mellitus without complication, without long-term current use of insulin ([place]) - Plan: Hemoglobin A1c  Immunization due - Plan: Pneumococcal (PPSV23) vaccine  Essential hypertension - Plan: CBC, Comprehensive metabolic panel  Dyslipidemia - Plan: Lipid panel  Moderate persistent asthma without complication - Plan: sed rate  Skin lesion of left leg - Plan: Ambulatory referral to Dermatology  Other viral warts  Here today for a follow-up visit. Gave Pneumovax Encouraged eye exam when possible Blood pressure under okay control Labs pending as above Referral to dermatology Cryotherapy for warts  This visit occurred during the SARS-CoV-2 public health emergency.  Safety protocols were in place, including screening questions prior to the visit, additional usage of staff PPE, and extensive cleaning of exam room while observing appropriate contact time as  indicated for disinfecting solutions.    Signed Lamar Blinks, MD  Received her labs 12/3- message to pt A1c is stable   Results for orders placed or performed in visit on 06/09/19  CBC  Result Value Ref Range   WBC 11.1 (H) 4.0 - 10.5 K/uL   RBC 5.04 3.87 - 5.11 Mil/uL   Platelets 236.0 150.0 - 400.0 K/uL   Hemoglobin 12.4 12.0 - 15.0 g/dL   HCT 38.9 36.0 - 46.0 %   MCV 77.2 (L) 78.0 - 100.0 fl   MCHC 31.9 30.0 - 36.0 g/dL   RDW 17.1 (H) 11.5 - 15.5 %  Comprehensive metabolic panel  Result Value Ref Range   Sodium 140 135 - 145 mEq/L   Potassium 4.7 3.5 - 5.1 mEq/L   Chloride 101 96 - 112 mEq/L   CO2 30 19 - 32 mEq/L   Glucose, Bld 115 (H) 70 - 99 mg/dL   BUN 25 (H) 6 - 23 mg/dL   Creatinine, Ser 0.75 0.40 - 1.20 mg/dL   Total Bilirubin 0.6 0.2 - 1.2 mg/dL   Alkaline Phosphatase 74 39 - 117 U/L   AST 10 0 - 37 U/L   ALT 15 0 - 35 U/L   Total Protein 6.6 6.0 - 8.3 g/dL   Albumin 4.0 3.5 - 5.2 g/dL   GFR 99.08 >60.00 mL/min   Calcium 9.0 8.4 - 10.5 mg/dL  Hemoglobin A1c  Result Value Ref Range   Hgb A1c MFr Bld 6.6 (H) 4.6 - 6.5 %  Lipid panel  Result Value Ref Range   Cholesterol 202 (H) 0 - 200 mg/dL   Triglycerides 88.0 0.0 - 149.0 mg/dL   HDL 44.10 >39.00 mg/dL   VLDL 17.6 0.0 - 40.0 mg/dL   LDL Cholesterol 141 (H) 0 - 99 mg/dL   Total CHOL/HDL Ratio 5    NonHDL 158.14   sed rate  Result Value Ref Range   Sed Rate 59 (H) 0 - 20 mm/hr   The  10-year ASCVD risk score Mikey Bussing DC Brooke Bonito., et al., 2013) is: 6%   Values used to calculate the score:     Age: 26 years     Sex: Female     Is Non-Hispanic African American: Yes     Diabetic: Yes     Tobacco smoker: No     Systolic Blood Pressure: [id number] mmHg     Is BP treated: Yes     HDL Cholesterol: 44.1 mg/dL     Total Cholesterol: 202 mg/dL  lood counts show minimally high wbc count. This is so minimal I tend to think it is benign.  I think ok to recheck at next routine labs Your MCV- average red cell size- is  slightly low. This can be due to low iron, but your iron level was normal in June of this year.  This may be just personal variation  Metabolic profile is ok [id number] shows good control of your diabetes  Your cholesterol is not bad- however I wonder if you might consider going up on simvastatin to try and get your LDL lower?  Let me know if ok and I will rx the 20 mg strength  Finally, your sed rate is still high.  It was high back in July and in fact it is worse now.  As you likely know, the sed rate is a non- specific test that can indicate some sort of inflammation.  This may mean something or nothing at all!  We might consider having you see rheumatology for evaluation, or repeat labs (and do some other autoimmune labs) in a month or so- let me know what you prefer to do next

## 2019-06-07 ENCOUNTER — Ambulatory Visit: Payer: Self-pay | Admitting: Family Medicine

## 2019-06-08 NOTE — Patient Instructions (Addendum)
It was good to see you again today, happy holidays!   I will be in touch with your labs asap Please do get your eyes checked at your convenience We will set you up to see dermatology about the skin lesion on your left leg

## 2019-06-09 ENCOUNTER — Ambulatory Visit: Payer: PRIVATE HEALTH INSURANCE | Admitting: Family Medicine

## 2019-06-09 ENCOUNTER — Other Ambulatory Visit: Payer: Self-pay

## 2019-06-09 ENCOUNTER — Encounter: Payer: Self-pay | Admitting: Family Medicine

## 2019-06-09 VITALS — BP 112/78 | HR 85 | Temp 96.5°F | Resp 18 | Ht 65.0 in | Wt 278.0 lb

## 2019-06-09 DIAGNOSIS — Z23 Encounter for immunization: Secondary | ICD-10-CM | POA: Diagnosis not present

## 2019-06-09 DIAGNOSIS — L989 Disorder of the skin and subcutaneous tissue, unspecified: Secondary | ICD-10-CM

## 2019-06-09 DIAGNOSIS — E119 Type 2 diabetes mellitus without complications: Secondary | ICD-10-CM | POA: Diagnosis not present

## 2019-06-09 DIAGNOSIS — E785 Hyperlipidemia, unspecified: Secondary | ICD-10-CM | POA: Diagnosis not present

## 2019-06-09 DIAGNOSIS — J4540 Moderate persistent asthma, uncomplicated: Secondary | ICD-10-CM | POA: Diagnosis not present

## 2019-06-09 DIAGNOSIS — I10 Essential (primary) hypertension: Secondary | ICD-10-CM

## 2019-06-09 DIAGNOSIS — B078 Other viral warts: Secondary | ICD-10-CM

## 2019-06-09 LAB — COMPREHENSIVE METABOLIC PANEL
ALT: 15 U/L (ref 0–35)
AST: 10 U/L (ref 0–37)
Albumin: 4 g/dL (ref 3.5–5.2)
Alkaline Phosphatase: 74 U/L (ref 39–117)
BUN: 25 mg/dL — ABNORMAL HIGH (ref 6–23)
CO2: 30 mEq/L (ref 19–32)
Calcium: 9 mg/dL (ref 8.4–10.5)
Chloride: 101 mEq/L (ref 96–112)
Creatinine, Ser: 0.75 mg/dL (ref 0.40–1.20)
GFR: 99.08 mL/min (ref >60.00)
Glucose, Bld: 115 mg/dL — ABNORMAL HIGH (ref 70–99)
Potassium: 4.7 mEq/L (ref 3.5–5.1)
Sodium: 140 mEq/L (ref 135–145)
Total Bilirubin: 0.6 mg/dL (ref 0.2–1.2)
Total Protein: 6.6 g/dL (ref 6.0–8.3)

## 2019-06-09 LAB — LIPID PANEL
Cholesterol: 202 mg/dL — ABNORMAL HIGH (ref 0–200)
HDL: 44.1 mg/dL (ref >39.00)
LDL Cholesterol: 141 mg/dL — ABNORMAL HIGH (ref 0–99)
NonHDL: 158.14
Total CHOL/HDL Ratio: 5
Triglycerides: 88 mg/dL (ref 0.0–149.0)
VLDL: 17.6 mg/dL (ref 0.0–40.0)

## 2019-06-09 LAB — CBC
HCT: 38.9 % (ref 36.0–46.0)
Hemoglobin: 12.4 g/dL (ref 12.0–15.0)
MCHC: 31.9 g/dL (ref 30.0–36.0)
MCV: 77.2 fl — ABNORMAL LOW (ref 78.0–100.0)
Platelets: 236 10*3/uL (ref 150.0–400.0)
RBC: 5.04 Mil/uL (ref 3.87–5.11)
RDW: 17.1 % — ABNORMAL HIGH (ref 11.5–15.5)
WBC: 11.1 10*3/uL — ABNORMAL HIGH (ref 4.0–10.5)

## 2019-06-09 LAB — HEMOGLOBIN A1C: Hgb A1c MFr Bld: 6.6 % — ABNORMAL HIGH (ref 4.6–6.5)

## 2019-06-09 LAB — SEDIMENTATION RATE: Sed Rate: 59 mm/hr — ABNORMAL HIGH (ref 0–20)

## 2019-06-10 ENCOUNTER — Encounter: Payer: Self-pay | Admitting: Family Medicine

## 2019-06-10 DIAGNOSIS — R700 Elevated erythrocyte sedimentation rate: Secondary | ICD-10-CM

## 2019-06-12 ENCOUNTER — Encounter: Payer: Self-pay | Admitting: Family Medicine

## 2019-06-12 DIAGNOSIS — E785 Hyperlipidemia, unspecified: Secondary | ICD-10-CM

## 2019-06-13 MED ORDER — SIMVASTATIN 20 MG PO TABS: 20.0000 mg | ORAL_TABLET | Freq: Every day | ORAL | 3 refills | Status: DC

## 2019-08-03 ENCOUNTER — Other Ambulatory Visit: Payer: Self-pay

## 2019-08-03 DIAGNOSIS — E876 Hypokalemia: Secondary | ICD-10-CM

## 2019-08-03 MED ORDER — POTASSIUM CHLORIDE CRYS ER 20 MEQ PO TBCR: 20.0000 meq | EXTENDED_RELEASE_TABLET | Freq: Every day | ORAL | 3 refills | Status: DC

## 2019-09-02 ENCOUNTER — Other Ambulatory Visit: Payer: Self-pay

## 2019-09-02 ENCOUNTER — Other Ambulatory Visit (INDEPENDENT_AMBULATORY_CARE_PROVIDER_SITE_OTHER): Payer: PRIVATE HEALTH INSURANCE

## 2019-09-02 ENCOUNTER — Encounter: Payer: Self-pay | Admitting: Family Medicine

## 2019-09-02 DIAGNOSIS — R768 Other specified abnormal immunological findings in serum: Secondary | ICD-10-CM

## 2019-09-02 DIAGNOSIS — R700 Elevated erythrocyte sedimentation rate: Secondary | ICD-10-CM

## 2019-09-02 DIAGNOSIS — R7989 Other specified abnormal findings of blood chemistry: Secondary | ICD-10-CM

## 2019-09-02 LAB — C-REACTIVE PROTEIN: CRP: 1.9 mg/dL (ref 0.5–20.0)

## 2019-09-02 LAB — SEDIMENTATION RATE: Sed Rate: 84 mm/hr — ABNORMAL HIGH (ref 0–20)

## 2019-09-03 ENCOUNTER — Encounter: Payer: Self-pay | Admitting: Family Medicine

## 2019-09-03 NOTE — Addendum Note (Signed)
Addended by: Lamar Blinks C on: 09/03/2019 01:33 PM   Modules accepted: Orders

## 2019-09-04 LAB — ANTI-NUCLEAR AB-TITER (ANA TITER): ANA Titer 1: 1:80 {titer} — ABNORMAL HIGH

## 2019-09-04 LAB — CYCLIC CITRUL PEPTIDE ANTIBODY, IGG: Cyclic Citrullin Peptide Ab: 53 UNITS — ABNORMAL HIGH

## 2019-09-04 LAB — RHEUMATOID FACTOR: Rheumatoid fact SerPl-aCnc: <14 IU/mL (ref <14)

## 2019-09-04 LAB — ANA: Anti Nuclear Antibody (ANA): POSITIVE — AB

## 2019-09-21 ENCOUNTER — Encounter: Payer: Self-pay | Admitting: Family Medicine

## 2019-09-21 DIAGNOSIS — R7989 Other specified abnormal findings of blood chemistry: Secondary | ICD-10-CM

## 2019-09-22 NOTE — Addendum Note (Signed)
Addended by: Lamar Blinks C on: 09/22/2019 03:18 PM   Modules accepted: Orders

## 2019-10-11 ENCOUNTER — Other Ambulatory Visit: Payer: Self-pay | Admitting: Family Medicine

## 2019-10-11 DIAGNOSIS — G2581 Restless legs syndrome: Secondary | ICD-10-CM

## 2019-10-13 ENCOUNTER — Encounter: Payer: Self-pay | Admitting: Family Medicine

## 2019-10-13 ENCOUNTER — Other Ambulatory Visit (INDEPENDENT_AMBULATORY_CARE_PROVIDER_SITE_OTHER): Payer: PRIVATE HEALTH INSURANCE

## 2019-10-13 ENCOUNTER — Other Ambulatory Visit: Payer: Self-pay

## 2019-10-13 DIAGNOSIS — R7989 Other specified abnormal findings of blood chemistry: Secondary | ICD-10-CM | POA: Diagnosis not present

## 2019-10-13 DIAGNOSIS — J9801 Acute bronchospasm: Secondary | ICD-10-CM

## 2019-10-13 LAB — BRAIN NATRIURETIC PEPTIDE: Pro B Natriuretic peptide (BNP): 136 pg/mL — ABNORMAL HIGH (ref 0.0–100.0)

## 2019-10-18 ENCOUNTER — Ambulatory Visit: Payer: PRIVATE HEALTH INSURANCE | Admitting: Family Medicine

## 2019-10-19 MED ORDER — MONTELUKAST SODIUM 10 MG PO TABS: 10.0000 mg | ORAL_TABLET | Freq: Every day | ORAL | 3 refills | Status: DC

## 2019-10-20 ENCOUNTER — Ambulatory Visit: Payer: PRIVATE HEALTH INSURANCE | Admitting: Family Medicine

## 2019-10-25 ENCOUNTER — Ambulatory Visit: Payer: PRIVATE HEALTH INSURANCE | Admitting: Family Medicine

## 2019-10-25 ENCOUNTER — Encounter: Payer: Self-pay | Admitting: Family Medicine

## 2019-10-25 DIAGNOSIS — R600 Localized edema: Secondary | ICD-10-CM

## 2019-10-25 MED ORDER — FUROSEMIDE 20 MG PO TABS: 20.0000 mg | ORAL_TABLET | Freq: Every day | ORAL | 1 refills | Status: DC | PRN

## 2019-10-26 ENCOUNTER — Other Ambulatory Visit: Payer: Self-pay | Admitting: Family Medicine

## 2019-10-26 DIAGNOSIS — I10 Essential (primary) hypertension: Secondary | ICD-10-CM

## 2019-10-31 NOTE — Progress Notes (Deleted)
[place] at [facility] [address] [place], [place] [address] [phone]  Date:  11/01/2019   Name:  Gabriela Torres   DOB:  February 20, 1971   MRN:  [id number]  PCP:  Darreld Mclean, MD    Chief Complaint: No chief complaint on file.   History of Present Illness:  Gabriela Torres is a 49 y.o. very pleasant female patient who presents with the following:  Patient with history of controlled diabetes, hyperlipidemia, hypertension, asthma, obesity Here today for a follow-up visit In mid March she went to an urgent care with illness, shortness of breath.  They did a BNP which was mildly elevated Belenda Cruise was concerned about this, I repeat it for her and it was still slightly elevated at 136 She expressed interest to have an echocardiogram which I did order, but so far this has not been done  She is also under the care of pain management through [facility], they are helping with her knee problems  I referred her to rheumatology in February due to an elevated sedimentation rate   Lab Results  Component Value Date   HGBA1C 6.6 (H) 06/09/2019   Albuterol as needed Carvedilol Clonidine Lexapro Lasix as needed HCTZ 25 Metformin thousand once a day Potassium 20 mg daily Zocor  COVID-19 series Eye exam Mammogram Colon cancer screening Foot exam is coming due Can suggest Shingrix Patient Active Problem List   Diagnosis Date Noted  . Asthma 08/05/2018  . Morbid obesity with BMI of 40.0-44.9, adult ([place]) 08/05/2018  . Cough 08/05/2018  . Diabetes mellitus type 2, controlled, without complications ([place]) Q000111Q  . Migraine 04/17/2013  . HTN (hypertension) 05/18/2012  . Hyperlipidemia 05/18/2012  . Ventral hernia 09/03/2011    Past Medical History:  Diagnosis Date  . Abdominal pain   . Abdominal wall hernia   . Anxiety   . Hearing loss   . Hyperlipidemia   . Hypertension   . Migraines     Past Surgical History:   Procedure Laterality Date  . ABDOMINAL HYSTERECTOMY  09/2003 or 09/2004    total   . HERNIA REPAIR    . NASAL TURBINATE REDUCTION  2000  . TONSILLECTOMY  2008  . VENTRAL HERNIA REPAIR  11/01/2011   Procedure: LAPAROSCOPIC VENTRAL HERNIA;  Surgeon: Merrie Roof, MD;  Location: [facility];  Service: General;  Laterality: N/A;    Social History   Tobacco Use  . Smoking status: Former Smoker    Packs/day: 0.25    Years: 1.00    Pack years: 0.25    Types: Cigarettes    Quit date: 10/24/1981    Years since quitting: 38.0  . Smokeless tobacco: Never Used  . Tobacco comment: social drinker  Substance Use Topics  . Alcohol use: Yes    Comment: 3 - 4 per week  . Drug use: No    Family History  Problem Relation Age of Onset  . Cancer Mother        abdominal  . Cancer Father        colon  . Heart disease Sister        chf    Allergies  Allergen Reactions  . Statins Other (See Comments)    Left lower and rear flank pain.  . Lisinopril     Angioedema   . Norvasc [Amlodipine] Other (See Comments)    Gum hyperplasia  . Food Nausea Only  Dill, rye  . Guaifenesin & Derivatives Nausea And Vomiting    Medication list has been reviewed and updated.  Current Outpatient Medications on File Prior to Visit  Medication Sig Dispense Refill  . albuterol (PROVENTIL HFA;VENTOLIN HFA) 108 (90 Base) MCG/ACT inhaler Inhale 2 puffs into the lungs every 6 (six) hours as needed for wheezing or shortness of breath. 1 Inhaler 3  . amphetamine-dextroamphetamine (ADDERALL) 20 MG tablet Take 20 mg by mouth daily.    . carvedilol (COREG) 12.5 MG tablet Take 1 tablet (12.5 mg total) by mouth 2 (two) times daily. 180 tablet 3  . cloNIDine (CATAPRES) 0.1 MG tablet TAKE ONE TABLET BY MOUTH TWICE A DAY 180 tablet 1  . DiphenhydrAMINE HCl (BENADRYL ALLERGY PO) Take 25 mg by mouth. Reported on 08/16/2015    . EPINEPHrine 0.3 mg/0.3 mL IJ SOAJ injection Inject 0.3 mLs (0.3 mg total) into the muscle once. 1  Device 2  . escitalopram (LEXAPRO) 20 MG tablet Take 20 mg by mouth daily.    . fluticasone (FLONASE) 50 MCG/ACT nasal spray Place 1 spray into both nostrils daily. 16 g 2  . fluticasone furoate-vilanterol (BREO ELLIPTA) 100-25 MCG/INH AEPB Inhale 1 puff into the lungs daily. 90 each 3  . furosemide (LASIX) 20 MG tablet Take 1 tablet (20 mg total) by mouth daily as needed. 30 tablet 1  . hydrochlorothiazide (HYDRODIURIL) 25 MG tablet Take 1 tablet (25 mg total) by mouth daily. 90 tablet 3  . ipratropium-albuterol (DUONEB) 0.5-2.5 (3) MG/3ML SOLN Take 3 mLs by nebulization every 6 (six) hours as needed. 360 mL 1  . metFORMIN (GLUCOPHAGE) 1000 MG tablet Take 1 tablet (1,000 mg total) by mouth daily with breakfast. 90 tablet 3  . montelukast (SINGULAIR) 10 MG tablet Take 1 tablet (10 mg total) by mouth at bedtime. 90 tablet 3  . potassium chloride SA (KLOR-CON) 20 MEQ tablet Take 1 tablet (20 mEq total) by mouth daily. 90 tablet 3  . rOPINIRole (REQUIP) 0.5 MG tablet TAKE ONE TABLET BY MOUTH ONE TIME DAILY AT BEDTIME 240 tablet 1  . simvastatin (ZOCOR) 20 MG tablet Take 1 tablet (20 mg total) by mouth daily. 90 tablet 3   No current facility-administered medications on file prior to visit.    Review of Systems:  As per HPI- otherwise negative.   Physical Examination: There were no vitals filed for this visit. There were no vitals filed for this visit. There is no height or weight on file to calculate BMI. Ideal Body Weight:    GEN: no acute distress. HEENT: Atraumatic, Normocephalic.  Ears and Nose: No external deformity. CV: RRR, No M/G/R. No JVD. No thrill. No extra heart sounds. PULM: CTA B, no wheezes, crackles, rhonchi. No retractions. No resp. distress. No accessory muscle use. ABD: S, NT, ND, +BS. No rebound. No HSM. EXTR: No c/c/e PSYCH: Normally interactive. Conversant.  Foot exam  Assessment and Plan: *** This visit occurred during the SARS-CoV-2 public health emergency.   Safety protocols were in place, including screening questions prior to the visit, additional usage of staff PPE, and extensive cleaning of exam room while observing appropriate contact time as indicated for disinfecting solutions.    Signed Lamar Blinks, MD

## 2019-11-01 ENCOUNTER — Ambulatory Visit: Payer: PRIVATE HEALTH INSURANCE | Admitting: Family Medicine

## 2019-12-10 ENCOUNTER — Encounter: Payer: Self-pay | Admitting: Family Medicine

## 2019-12-13 ENCOUNTER — Encounter: Payer: Self-pay | Admitting: Family Medicine

## 2019-12-13 DIAGNOSIS — Z5181 Encounter for therapeutic drug level monitoring: Secondary | ICD-10-CM

## 2019-12-14 MED ORDER — TRIAMTERENE-HCTZ 37.5-25 MG PO TABS
1.0000 | ORAL_TABLET | Freq: Every day | ORAL | 3 refills | Status: DC
Start: 2019-12-14 — End: 2019-12-16

## 2019-12-14 NOTE — Addendum Note (Signed)
Addended by: Lamar Blinks C on: 12/14/2019 07:01 AM   Modules accepted: Orders

## 2019-12-16 ENCOUNTER — Encounter: Payer: Self-pay | Admitting: Family Medicine

## 2019-12-16 MED ORDER — TRIAMTERENE-HCTZ 37.5-25 MG PO TABS: 1.0000 | ORAL_TABLET | Freq: Every day | ORAL | 3 refills | Status: DC

## 2019-12-23 ENCOUNTER — Other Ambulatory Visit: Payer: Self-pay

## 2019-12-23 ENCOUNTER — Other Ambulatory Visit (INDEPENDENT_AMBULATORY_CARE_PROVIDER_SITE_OTHER): Payer: Managed Care, Other (non HMO)

## 2019-12-23 ENCOUNTER — Telehealth: Payer: Self-pay | Admitting: *Deleted

## 2019-12-23 DIAGNOSIS — E785 Hyperlipidemia, unspecified: Secondary | ICD-10-CM

## 2019-12-23 DIAGNOSIS — Z5181 Encounter for therapeutic drug level monitoring: Secondary | ICD-10-CM | POA: Diagnosis not present

## 2019-12-23 DIAGNOSIS — E119 Type 2 diabetes mellitus without complications: Secondary | ICD-10-CM

## 2019-12-23 NOTE — Addendum Note (Signed)
Addended by: Kem Boroughs D on: 12/23/2019 05:15 PM   Modules accepted: Orders

## 2019-12-23 NOTE — Telephone Encounter (Signed)
Patient came in for blood draw today and would like to have her cholesterol and a1c check.  Can we add those on?  I drew enough.

## 2019-12-23 NOTE — Telephone Encounter (Signed)
Placed orders for patient

## 2019-12-24 LAB — BASIC METABOLIC PANEL
BUN: 23 mg/dL (ref 6–23)
CO2: 29 mEq/L (ref 19–32)
Calcium: 9.8 mg/dL (ref 8.4–10.5)
Chloride: 101 mEq/L (ref 96–112)
Creatinine, Ser: 0.74 mg/dL (ref 0.40–1.20)
GFR: 100.4 mL/min (ref >60.00)
Glucose, Bld: 95 mg/dL (ref 70–99)
Potassium: 4.2 mEq/L (ref 3.5–5.1)
Sodium: 139 mEq/L (ref 135–145)

## 2019-12-24 LAB — LIPID PANEL
Cholesterol: 191 mg/dL (ref 0–200)
HDL: 47.2 mg/dL (ref >39.00)
LDL Cholesterol: 126 mg/dL — ABNORMAL HIGH (ref 0–99)
NonHDL: 143.94
Total CHOL/HDL Ratio: 4
Triglycerides: 90 mg/dL (ref 0.0–149.0)
VLDL: 18 mg/dL (ref 0.0–40.0)

## 2019-12-24 LAB — HEMOGLOBIN A1C: Hgb A1c MFr Bld: 6.1 % (ref 4.6–6.5)

## 2019-12-25 ENCOUNTER — Encounter: Payer: Self-pay | Admitting: Family Medicine

## 2019-12-30 ENCOUNTER — Other Ambulatory Visit: Payer: Self-pay | Admitting: *Deleted

## 2019-12-30 DIAGNOSIS — R600 Localized edema: Secondary | ICD-10-CM

## 2019-12-30 MED ORDER — FUROSEMIDE 20 MG PO TABS: 20.0000 mg | ORAL_TABLET | Freq: Every day | ORAL | 1 refills | Status: DC | PRN

## 2020-01-03 ENCOUNTER — Other Ambulatory Visit (HOSPITAL_COMMUNITY): Payer: PRIVATE HEALTH INSURANCE

## 2020-01-25 ENCOUNTER — Other Ambulatory Visit (HOSPITAL_COMMUNITY): Payer: Managed Care, Other (non HMO)

## 2020-01-27 ENCOUNTER — Other Ambulatory Visit: Payer: Self-pay

## 2020-01-27 ENCOUNTER — Other Ambulatory Visit: Payer: Self-pay | Admitting: Family Medicine

## 2020-01-27 DIAGNOSIS — I10 Essential (primary) hypertension: Secondary | ICD-10-CM

## 2020-01-27 MED ORDER — CARVEDILOL 12.5 MG PO TABS: 12.5000 mg | ORAL_TABLET | Freq: Two times a day (BID) | ORAL | 1 refills | Status: DC

## 2020-02-28 ENCOUNTER — Ambulatory Visit (HOSPITAL_COMMUNITY): Payer: Managed Care, Other (non HMO) | Attending: Cardiology

## 2020-02-28 ENCOUNTER — Encounter: Payer: Self-pay | Admitting: Family Medicine

## 2020-02-28 ENCOUNTER — Other Ambulatory Visit: Payer: Self-pay

## 2020-02-28 DIAGNOSIS — R7989 Other specified abnormal findings of blood chemistry: Secondary | ICD-10-CM

## 2020-02-28 DIAGNOSIS — Z87891 Personal history of nicotine dependence: Secondary | ICD-10-CM | POA: Insufficient documentation

## 2020-02-28 DIAGNOSIS — Z8249 Family history of ischemic heart disease and other diseases of the circulatory system: Secondary | ICD-10-CM | POA: Insufficient documentation

## 2020-02-28 DIAGNOSIS — E785 Hyperlipidemia, unspecified: Secondary | ICD-10-CM | POA: Diagnosis not present

## 2020-02-28 DIAGNOSIS — I5021 Acute systolic (congestive) heart failure: Secondary | ICD-10-CM | POA: Diagnosis not present

## 2020-02-28 DIAGNOSIS — I10 Essential (primary) hypertension: Secondary | ICD-10-CM

## 2020-02-28 DIAGNOSIS — I110 Hypertensive heart disease with heart failure: Secondary | ICD-10-CM | POA: Insufficient documentation

## 2020-02-28 LAB — ECHOCARDIOGRAM COMPLETE
Area-P 1/2: 2.21 cm2
S' Lateral: 3.15 cm

## 2020-03-04 ENCOUNTER — Encounter: Payer: Self-pay | Admitting: Family Medicine

## 2020-03-04 DIAGNOSIS — L659 Nonscarring hair loss, unspecified: Secondary | ICD-10-CM

## 2020-03-15 ENCOUNTER — Other Ambulatory Visit (INDEPENDENT_AMBULATORY_CARE_PROVIDER_SITE_OTHER): Payer: Managed Care, Other (non HMO)

## 2020-03-15 ENCOUNTER — Other Ambulatory Visit: Payer: Self-pay

## 2020-03-15 DIAGNOSIS — L659 Nonscarring hair loss, unspecified: Secondary | ICD-10-CM | POA: Diagnosis not present

## 2020-03-16 ENCOUNTER — Encounter: Payer: Self-pay | Admitting: Family Medicine

## 2020-03-16 LAB — TSH: TSH: 2.16 mIU/L

## 2020-03-16 LAB — FERRITIN: Ferritin: 156 ng/mL (ref 16–232)

## 2020-03-27 ENCOUNTER — Encounter: Payer: Self-pay | Admitting: Family Medicine

## 2020-04-13 ENCOUNTER — Encounter: Payer: Self-pay | Admitting: Family Medicine

## 2020-04-24 ENCOUNTER — Ambulatory Visit: Payer: Managed Care, Other (non HMO) | Admitting: Family Medicine

## 2020-05-07 ENCOUNTER — Other Ambulatory Visit: Payer: Self-pay | Admitting: Family Medicine

## 2020-05-07 DIAGNOSIS — G2581 Restless legs syndrome: Secondary | ICD-10-CM

## 2020-05-09 NOTE — Progress Notes (Addendum)
[place] [facility] at [facility] [place], [place], [place] [address] [phone]  Date:  05/10/2020   Name:  Gabriela Torres   DOB:  02/14/1971   MRN:  [id number]  PCP:  Darreld Mclean, MD    Chief Complaint: Nevus (pt states mole on left calf, Pt states no redness or pain. )   History of Present Illness:  Gabriela Torres is a 49 y.o. very pleasant female patient who presents with the following:  Patient today for flu vaccine and also mole removal History of well-controlled diabetes, hypertension, hyperlipidemia, asthma, obesity Last seen by myself almost a year ago, although we have exchanged numerous MyChart messages in the interim  She notes a mole on the back of her left leg which she would like removed; it has been present for about 20 years.  It is not painful but pt does not like appearance and would like removed  We discussed shave versus excision.  I feel that shave would give as good of a cosmetic result if not better, due to the size of the lesion we would have to do a fairly wide excision to avoid "dogear" of wound However, a shave removal may be more apt to bleed.  She would like to proceed with shave today Flu vaccine- give today  Eye exam COVID-19 vaccine- done already including booster, she will get me the states Mammogram- this is due, order for pt today    Lipid profile, A1c, TSH all current Most recent A1c 6.1%   Patient Active Problem List   Diagnosis Date Noted  . Asthma 08/05/2018  . Morbid obesity with BMI of 40.0-44.9, adult ([place]) 08/05/2018  . Cough 08/05/2018  . Diabetes mellitus type 2, controlled, without complications ([facility]) 27/78/2423  . Migraine 04/17/2013  . HTN (hypertension) 05/18/2012  . Hyperlipidemia 05/18/2012  . Ventral hernia 09/03/2011    Past Medical History:  Diagnosis Date  . Abdominal pain   . Abdominal wall hernia   . Anxiety   . Hearing loss   . Hyperlipidemia   .  Hypertension   . Migraines     Past Surgical History:  Procedure Laterality Date  . ABDOMINAL HYSTERECTOMY  09/2003 or 09/2004    total   . HERNIA REPAIR    . NASAL TURBINATE REDUCTION  2000  . TONSILLECTOMY  2008  . VENTRAL HERNIA REPAIR  11/01/2011   Procedure: LAPAROSCOPIC VENTRAL HERNIA;  Surgeon: Merrie Roof, MD;  Location: [facility];  Service: General;  Laterality: N/A;    Social History   Tobacco Use  . Smoking status: Former Smoker    Packs/day: 0.25    Years: 1.00    Pack years: 0.25    Types: Cigarettes    Quit date: 10/24/1981    Years since quitting: 38.5  . Smokeless tobacco: Never Used  . Tobacco comment: social drinker  Vaping Use  . Vaping Use: Never used  Substance Use Topics  . Alcohol use: Yes    Comment: 3 - 4 per week  . Drug use: No    Family History  Problem Relation Age of Onset  . Cancer Mother        abdominal  . Cancer Father        colon  . Heart disease Sister        chf    Allergies  Allergen Reactions  . Statins Other (See Comments)    Left  lower and rear flank pain.  . Lisinopril     Angioedema   . Norvasc [Amlodipine] Other (See Comments)    Gum hyperplasia  . Food Nausea Only    Dill, rye  . Guaifenesin & Derivatives Nausea And Vomiting    Medication list has been reviewed and updated.  Current Outpatient Medications on File Prior to Visit  Medication Sig Dispense Refill  . albuterol (PROVENTIL HFA;VENTOLIN HFA) 108 (90 Base) MCG/ACT inhaler Inhale 2 puffs into the lungs every 6 (six) hours as needed for wheezing or shortness of breath. 1 Inhaler 3  . amphetamine-dextroamphetamine (ADDERALL) 20 MG tablet Take 20 mg by mouth daily.    . carvedilol (COREG) 12.5 MG tablet Take 1 tablet (12.5 mg total) by mouth 2 (two) times daily. 180 tablet 1  . cloNIDine (CATAPRES) 0.1 MG tablet TAKE ONE TABLET BY MOUTH TWICE A DAY 180 tablet 1  . EPINEPHrine 0.3 mg/0.3 mL IJ SOAJ injection Inject 0.3 mLs (0.3 mg total) into the muscle  once. 1 Device 2  . escitalopram (LEXAPRO) 20 MG tablet Take 20 mg by mouth daily.    . fluticasone (FLONASE) 50 MCG/ACT nasal spray Place 1 spray into both nostrils daily. 16 g 2  . furosemide (LASIX) 20 MG tablet Take 1 tablet (20 mg total) by mouth daily as needed. 30 tablet 1  . ipratropium-albuterol (DUONEB) 0.5-2.5 (3) MG/3ML SOLN Take 3 mLs by nebulization every 6 (six) hours as needed. 360 mL 1  . montelukast (SINGULAIR) 10 MG tablet Take 1 tablet (10 mg total) by mouth at bedtime. 90 tablet 3  . rOPINIRole (REQUIP) 0.5 MG tablet Take 1 tablet (0.5 mg total) by mouth at bedtime. 90 tablet 1  . simvastatin (ZOCOR) 20 MG tablet Take 1 tablet (20 mg total) by mouth daily. 90 tablet 3  . triamterene-hydrochlorothiazide (MAXZIDE-25) 37.5-25 MG tablet Take 1 tablet by mouth daily. 90 tablet 3  . DiphenhydrAMINE HCl (BENADRYL ALLERGY PO) Take 25 mg by mouth. Reported on 08/16/2015    . fluticasone furoate-vilanterol (BREO ELLIPTA) 100-25 MCG/INH AEPB Inhale 1 puff into the lungs daily. 90 each 3  . metFORMIN (GLUCOPHAGE) 1000 MG tablet Take 1 tablet (1,000 mg total) by mouth daily with breakfast. 90 tablet 3   No current facility-administered medications on file prior to visit.    Review of Systems:  As per HPI- otherwise negative.   Physical Examination: Vitals:   05/10/20 1439  BP: 130/88  Pulse: 91  Resp: 18  Temp: 97.9 F (36.6 C)  SpO2: 98%   Vitals:   05/10/20 1439  Weight: 270 lb 6.4 oz (122.7 kg)  Height: 5\' 5"  (1.651 m)   Body mass index is 45 kg/m. Ideal Body Weight: Weight in (lb) to have BMI = 25: 149.9  GEN: no acute distress. HEENT: Atraumatic, Normocephalic.  Ears and Nose: No external deformity. CV: RRR, No M/G/R. No JVD. No thrill. No extra heart sounds. PULM: CTA B, no wheezes, crackles, rhonchi. No retractions. No resp. distress. No accessory muscle use. ABD: S, NT, ND, +BS. No rebound. No HSM. EXTR: No c/c/e PSYCH: Normally interactive. Conversant.    Note a firm, hypertrophic keratinized round skin lesion on the posterior left calf.  approx 10 mm in diameter.  Per patient, it has been present for about 20 years.  Very low concern for skin cancer  VC obtained-discussed risk of infection or bleeding, alternatives include leaving the skin lesion alone order from dermatology.  She would like to proceed  with a shave removal today Anesthesia with 4 ml of 2% lido with epi Removed skin lesion with derma blade Lesion was quite superficial, does not go past the dermal layer Hemostasis achieved with silver nitrate sticks Bandage placed  Specimen sent to path   Assessment and Plan: Need for influenza vaccination - Plan: Flu Vaccine QUAD 36+ mos IM  Encounter for screening mammogram for malignant neoplasm of breast - Plan: MM 3D SCREEN BREAST BILATERAL  Skin lesion of left leg - Plan: Surgical pathology( [facility]/ [facility])  Suspicious nevus - Plan: Surgical pathology( [place]/ [facility])  Restless legs - Plan: rOPINIRole (REQUIP) 1 MG tablet  Flu shot given Removed skin lesion from like today-see patient instructions for wound care guidelines Patient notes she tends to take 2 of her Requip 0.5 at bedtime for restless legs.  We will increase her dose to 1 mg Will plan further follow- up pending labs.  This visit occurred during the SARS-CoV-2 public health emergency.  Safety protocols were in place, including screening questions prior to the visit, additional usage of staff PPE, and extensive cleaning of exam room while observing appropriate contact time as indicated for disinfecting solutions.    Signed Lamar Blinks, MD

## 2020-05-10 ENCOUNTER — Ambulatory Visit: Payer: Managed Care, Other (non HMO) | Admitting: Family Medicine

## 2020-05-10 ENCOUNTER — Other Ambulatory Visit: Payer: Self-pay | Admitting: Family Medicine

## 2020-05-10 ENCOUNTER — Encounter: Payer: Self-pay | Admitting: Family Medicine

## 2020-05-10 ENCOUNTER — Other Ambulatory Visit: Payer: Self-pay

## 2020-05-10 VITALS — BP 130/88 | HR 91 | Temp 97.9°F | Resp 18 | Ht 65.0 in | Wt 270.4 lb

## 2020-05-10 DIAGNOSIS — Z23 Encounter for immunization: Secondary | ICD-10-CM

## 2020-05-10 DIAGNOSIS — D229 Melanocytic nevi, unspecified: Secondary | ICD-10-CM

## 2020-05-10 DIAGNOSIS — Z1231 Encounter for screening mammogram for malignant neoplasm of breast: Secondary | ICD-10-CM

## 2020-05-10 DIAGNOSIS — L989 Disorder of the skin and subcutaneous tissue, unspecified: Secondary | ICD-10-CM | POA: Diagnosis not present

## 2020-05-10 DIAGNOSIS — G2581 Restless legs syndrome: Secondary | ICD-10-CM | POA: Diagnosis not present

## 2020-05-10 MED ORDER — ROPINIROLE HCL 1 MG PO TABS: 1.0000 mg | ORAL_TABLET | Freq: Every day | ORAL | 3 refills | Status: DC

## 2020-05-10 NOTE — Patient Instructions (Addendum)
Good to see you again today! Flu shot given I will be in touch with your pathology report Please keep the overwrap on the skin site for about an hour, than remove. Leave clean and covered today- tomorrow you can shower and wash gently with soap and water as needed, pat dry and cover with dressing for the next few days.  If any bleeding apply pressure- if any persistent bleeding or other concerns please seek care!   Increase requip to 1 mg at bedtime

## 2020-06-03 ENCOUNTER — Other Ambulatory Visit: Payer: Self-pay | Admitting: Family Medicine

## 2020-06-03 DIAGNOSIS — I10 Essential (primary) hypertension: Secondary | ICD-10-CM

## 2020-06-12 ENCOUNTER — Other Ambulatory Visit: Payer: Self-pay | Admitting: Family Medicine

## 2020-06-23 ENCOUNTER — Encounter: Payer: Self-pay | Admitting: Family Medicine

## 2020-06-23 DIAGNOSIS — E785 Hyperlipidemia, unspecified: Secondary | ICD-10-CM

## 2020-06-23 MED ORDER — SIMVASTATIN 20 MG PO TABS: 20.0000 mg | ORAL_TABLET | Freq: Every day | ORAL | 3 refills | Status: DC

## 2020-06-29 ENCOUNTER — Other Ambulatory Visit: Payer: Self-pay | Admitting: Family Medicine

## 2020-06-29 DIAGNOSIS — I10 Essential (primary) hypertension: Secondary | ICD-10-CM

## 2020-06-29 DIAGNOSIS — R600 Localized edema: Secondary | ICD-10-CM

## 2020-07-11 ENCOUNTER — Inpatient Hospital Stay (HOSPITAL_BASED_OUTPATIENT_CLINIC_OR_DEPARTMENT_OTHER): Admission: RE | Admit: 2020-07-11 | Payer: Managed Care, Other (non HMO) | Source: Ambulatory Visit

## 2020-07-22 ENCOUNTER — Other Ambulatory Visit: Payer: Self-pay | Admitting: Family Medicine

## 2020-07-22 DIAGNOSIS — I10 Essential (primary) hypertension: Secondary | ICD-10-CM

## 2020-07-28 ENCOUNTER — Ambulatory Visit (HOSPITAL_BASED_OUTPATIENT_CLINIC_OR_DEPARTMENT_OTHER): Payer: Managed Care, Other (non HMO)

## 2020-07-31 ENCOUNTER — Encounter (HOSPITAL_BASED_OUTPATIENT_CLINIC_OR_DEPARTMENT_OTHER): Payer: Self-pay

## 2020-07-31 ENCOUNTER — Other Ambulatory Visit: Payer: Self-pay

## 2020-07-31 ENCOUNTER — Ambulatory Visit (HOSPITAL_BASED_OUTPATIENT_CLINIC_OR_DEPARTMENT_OTHER)
Admission: RE | Admit: 2020-07-31 | Discharge: 2020-07-31 | Disposition: A | Discharge status: Home / Self Care | Payer: Managed Care, Other (non HMO) | Source: Ambulatory Visit | Attending: Family Medicine | Admitting: Family Medicine

## 2020-07-31 DIAGNOSIS — Z1231 Encounter for screening mammogram for malignant neoplasm of breast: Secondary | ICD-10-CM | POA: Diagnosis present

## 2020-08-14 ENCOUNTER — Other Ambulatory Visit: Payer: Self-pay | Admitting: Family Medicine

## 2020-08-14 DIAGNOSIS — J9801 Acute bronchospasm: Secondary | ICD-10-CM

## 2020-08-21 ENCOUNTER — Encounter: Payer: Self-pay | Admitting: Family Medicine

## 2020-08-21 DIAGNOSIS — E119 Type 2 diabetes mellitus without complications: Secondary | ICD-10-CM

## 2020-08-21 DIAGNOSIS — I10 Essential (primary) hypertension: Secondary | ICD-10-CM

## 2020-08-21 DIAGNOSIS — M791 Myalgia, unspecified site: Secondary | ICD-10-CM

## 2020-08-22 ENCOUNTER — Other Ambulatory Visit: Payer: Self-pay

## 2020-08-22 ENCOUNTER — Other Ambulatory Visit (INDEPENDENT_AMBULATORY_CARE_PROVIDER_SITE_OTHER): Payer: Managed Care, Other (non HMO)

## 2020-08-22 DIAGNOSIS — M791 Myalgia, unspecified site: Secondary | ICD-10-CM

## 2020-08-22 DIAGNOSIS — I10 Essential (primary) hypertension: Secondary | ICD-10-CM | POA: Diagnosis not present

## 2020-08-22 DIAGNOSIS — E119 Type 2 diabetes mellitus without complications: Secondary | ICD-10-CM

## 2020-08-22 LAB — COMPREHENSIVE METABOLIC PANEL
ALT: 10 U/L (ref 0–35)
AST: 10 U/L (ref 0–37)
Albumin: 4 g/dL (ref 3.5–5.2)
Alkaline Phosphatase: 79 U/L (ref 39–117)
BUN: 18 mg/dL (ref 6–23)
CO2: 30 mEq/L (ref 19–32)
Calcium: 9.2 mg/dL (ref 8.4–10.5)
Chloride: 99 mEq/L (ref 96–112)
Creatinine, Ser: 0.8 mg/dL (ref 0.40–1.20)
GFR: 85.59 mL/min (ref >60.00)
Glucose, Bld: 82 mg/dL (ref 70–99)
Potassium: 3.8 mEq/L (ref 3.5–5.1)
Sodium: 138 mEq/L (ref 135–145)
Total Bilirubin: 0.6 mg/dL (ref 0.2–1.2)
Total Protein: 7.2 g/dL (ref 6.0–8.3)

## 2020-08-22 LAB — CBC
HCT: 42.3 % (ref 36.0–46.0)
Hemoglobin: 13.7 g/dL (ref 12.0–15.0)
MCHC: 32.5 g/dL (ref 30.0–36.0)
MCV: 77.6 fl — ABNORMAL LOW (ref 78.0–100.0)
Platelets: 224 10*3/uL (ref 150.0–400.0)
RBC: 5.46 Mil/uL — ABNORMAL HIGH (ref 3.87–5.11)
RDW: 15.8 % — ABNORMAL HIGH (ref 11.5–15.5)
WBC: 7.3 10*3/uL (ref 4.0–10.5)

## 2020-08-22 LAB — CK: Total CK: 85 U/L (ref 7–177)

## 2020-08-22 LAB — HEMOGLOBIN A1C: Hgb A1c MFr Bld: 6.4 % (ref 4.6–6.5)

## 2020-08-23 ENCOUNTER — Encounter: Payer: Self-pay | Admitting: Family Medicine

## 2020-09-20 ENCOUNTER — Other Ambulatory Visit: Payer: Self-pay | Admitting: Family Medicine

## 2020-09-20 DIAGNOSIS — J9801 Acute bronchospasm: Secondary | ICD-10-CM

## 2020-09-28 ENCOUNTER — Encounter: Payer: Self-pay | Admitting: Family Medicine

## 2020-10-01 ENCOUNTER — Encounter: Payer: Self-pay | Admitting: Family Medicine

## 2020-10-02 NOTE — Progress Notes (Signed)
[facility] at [facility] [address], [place], [place] [address] [phone]  Date:  10/04/2020   Name:  Gabriela Torres   DOB:  1971/01/06   MRN:  [id number]  PCP:  Darreld Mclean, MD    Chief Complaint: No chief complaint on file.   History of Present Illness:  Gabriela Torres is a 50 y.o. very pleasant female patient who presents with the following:  Virtual visit today for concern of URI Last seen by myself in November Pt location is home, my location is office Pt ID confirmed with 2 factors, she gives consent for virtual visit today  The patient and myself are present on the call today  History of well-controlled diabetes, hypertension, hyperlipidemia, asthma, obesity  Lab Results  Component Value Date   HGBA1C 6.4 08/22/2020   She recently contacted me with the following message: Okay Dr. I have tried my best to manage this on my own but today officially marks the two week mark and I am really tired of coughing up stuff and the aches that come with doing that repeatedly, so it's time to call in my personal obi wan professional (you).This message may be a little long but I  think it's best to start at the beginning.  Two weeks ago it started with a sore throat and then (I call it a upper respiratory infection) it progressed upwards to my sinuses, stuffy nose,trouble breathing etc... then it moved downwards into my chest and lungs...soooooooo much coughing and aches.  Lots of continual mucus production,  globules like cottage cheese at first and now its mostly gone but I am  still coughing up thick yellowish mucus. I have taken otc cold and flu soft gels and vicks vaporub on my chest and neck at night. 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 So can you please prescribe or suggest something that will help this stuff dry up so that I can start work without the constant distraction, discomfort and need to spit out this disgusting stuff every few minutes? Sorry for the graphic details. My throat feels like I swallowed a handful of thumbtacks wrapped in sandpaper. There has also been some blood streaked mucus too from coughing so hard,my ribs ache.   She continues to have coughing spells and is bringing up mucus Her job start date was pushed back to next Wednesday No fever She did have some chills but not resolved Some headaches and body aches from coughing  She still does have a ST Some wheezing noted  Sx are worse at night or with exertion- she does have albuterol on hand  No vomiting or diarrhea except last week - she had post tussive emesis but otherwise none At home covid test negative   Patient Active Problem List   Diagnosis Date Noted  . Asthma 08/05/2018  . Morbid obesity with BMI of 40.0-44.9, adult ([place]) 08/05/2018  . Cough 08/05/2018  . Diabetes mellitus type 2, controlled, without complications ([facility]) 84/53/6468  . Migraine 04/17/2013  . HTN (hypertension) 05/18/2012  . Hyperlipidemia 05/18/2012  . Ventral hernia 09/03/2011    Past Medical History:  Diagnosis Date  . Abdominal pain   . Abdominal wall hernia   .  Anxiety   . Hearing loss   . Hyperlipidemia   . Hypertension   . Migraines     Past Surgical History:  Procedure Laterality Date  . ABDOMINAL HYSTERECTOMY  09/2003 or 09/2004    total   . HERNIA REPAIR    . NASAL TURBINATE REDUCTION  2000  . TONSILLECTOMY  2008  . VENTRAL HERNIA REPAIR  11/01/2011   Procedure: LAPAROSCOPIC VENTRAL HERNIA;  Surgeon: Merrie Roof, MD;  Location: [facility];  Service: General;  Laterality: N/A;    Social History   Tobacco Use  . Smoking status: Former Smoker    Packs/day: 0.25    Years: 1.00    Pack years: 0.25    Types: Cigarettes    Quit date:  10/24/1981    Years since quitting: 38.9  . Smokeless tobacco: Never Used  . Tobacco comment: social drinker  Vaping Use  . Vaping Use: Never used  Substance Use Topics  . Alcohol use: Yes    Comment: 3 - 4 per week  . Drug use: No    Family History  Problem Relation Age of Onset  . Cancer Mother        abdominal  . Cancer Father        colon  . Heart disease Sister        chf  . Breast cancer Maternal Aunt     Allergies  Allergen Reactions  . Statins Other (See Comments)    Left lower and rear flank pain.  . Lisinopril     Angioedema   . Norvasc [Amlodipine] Other (See Comments)    Gum hyperplasia  . Food Nausea Only    Dill, rye  . Guaifenesin & Derivatives Nausea And Vomiting    Medication list has been reviewed and updated.  Current Outpatient Medications on File Prior to Visit  Medication Sig Dispense Refill  . albuterol (PROVENTIL HFA;VENTOLIN HFA) 108 (90 Base) MCG/ACT inhaler Inhale 2 puffs into the lungs every 6 (six) hours as needed for wheezing or shortness of breath. 1 Inhaler 3  . amphetamine-dextroamphetamine (ADDERALL) 20 MG tablet Take 20 mg by mouth daily.    . carvedilol (COREG) 12.5 MG tablet TAKE 1 TABLET(12.5 MG) BY MOUTH TWICE DAILY 180 tablet 1  . cloNIDine (CATAPRES) 0.1 MG tablet Take 1 tablet (0.1 mg total) by mouth 2 (two) times daily. 180 tablet 1  . DiphenhydrAMINE HCl (BENADRYL ALLERGY PO) Take 25 mg by mouth. Reported on 08/16/2015    . EPINEPHrine 0.3 mg/0.3 mL IJ SOAJ injection Inject 0.3 mLs (0.3 mg total) into the muscle once. 1 Device 2  . escitalopram (LEXAPRO) 20 MG tablet Take 20 mg by mouth daily.    . fluticasone (FLONASE) 50 MCG/ACT nasal spray Place 1 spray into both nostrils daily. 16 g 2  . fluticasone furoate-vilanterol (BREO ELLIPTA) 100-25 MCG/INH AEPB Inhale 1 puff into the lungs daily. 90 each 3  . furosemide (LASIX) 20 MG tablet Take 1 tablet (20 mg total) by mouth daily as needed. 90 tablet 1  . ipratropium-albuterol  (DUONEB) 0.5-2.5 (3) MG/3ML SOLN Take 3 mLs by nebulization every 6 (six) hours as needed. 360 mL 1  . metFORMIN (GLUCOPHAGE) 1000 MG tablet Take 1 tablet (1,000 mg total) by mouth daily with breakfast. 90 tablet 3  . montelukast (SINGULAIR) 10 MG tablet TAKE ONE TABLET BY MOUTH ONE TIME DAILY AT BEDTIME 90 tablet 3  . rOPINIRole (REQUIP) 1 MG tablet Take 1 tablet (1 mg total)  by mouth at bedtime. 90 tablet 3  . simvastatin (ZOCOR) 20 MG tablet Take 1 tablet (20 mg total) by mouth daily. 90 tablet 3  . triamterene-hydrochlorothiazide (MAXZIDE-25) 37.5-25 MG tablet Take 1 tablet by mouth daily. 90 tablet 3   No current facility-administered medications on file prior to visit.    Review of Systems:  As per HPI- otherwise negative.   Physical Examination: There were no vitals filed for this visit. There were no vitals filed for this visit. There is no height or weight on file to calculate BMI. Ideal Body Weight:    Pt observed via mychart video -she looks well, her normal self Occasional cough, no wheezing or distress noted.  She is not checking vital signs at home  Assessment and Plan: Acute bronchitis, unspecified organism - Plan: doxycycline (VIBRAMYCIN) 100 MG capsule, predniSONE (DELTASONE) 20 MG tablet  Bronchospasm - Plan: albuterol (VENTOLIN HFA) 108 (90 Base) MCG/ACT inhaler  Virtual visit today to discuss recent illness.  Patient has been sick with likely bronchitis and bronchospasm symptoms for about 2 weeks.  We will treat her with doxycycline, prednisone for 6 days.  Also refilled her albuterol inhaler.  She is asked to contact me if not feeling better in the next 2 days, sooner if getting worse  Signed Lamar Blinks, MD

## 2020-10-04 ENCOUNTER — Other Ambulatory Visit: Payer: Self-pay

## 2020-10-04 ENCOUNTER — Telehealth (INDEPENDENT_AMBULATORY_CARE_PROVIDER_SITE_OTHER): Payer: Self-pay | Admitting: Family Medicine

## 2020-10-04 DIAGNOSIS — J209 Acute bronchitis, unspecified: Secondary | ICD-10-CM

## 2020-10-04 DIAGNOSIS — J9801 Acute bronchospasm: Secondary | ICD-10-CM

## 2020-10-04 MED ORDER — PREDNISONE 20 MG PO TABS: ORAL_TABLET | ORAL | 0 refills | Status: DC

## 2020-10-04 MED ORDER — DOXYCYCLINE HYCLATE 100 MG PO CAPS: 100.0000 mg | ORAL_CAPSULE | Freq: Two times a day (BID) | ORAL | 0 refills | Status: DC

## 2020-10-04 MED ORDER — ALBUTEROL SULFATE HFA 108 (90 BASE) MCG/ACT IN AERS: 2.0000 | INHALATION_SPRAY | Freq: Four times a day (QID) | RESPIRATORY_TRACT | 3 refills | Status: DC | PRN

## 2020-10-06 ENCOUNTER — Encounter: Payer: Self-pay | Admitting: Family Medicine

## 2020-10-06 NOTE — Telephone Encounter (Signed)
Called pt back- she is already on steroids and abx, and albuterol She sounds very SOB and like she is in mild distress She notes pain in her right chest and difficulty breathing I asked her to go to the ER immediately Offered to call an ambulance for her, she declines as her brother is with her and she states he will drive her She agrees with ER care

## 2020-10-06 NOTE — Telephone Encounter (Signed)
Seen 3/30 VV for Bronchitis.

## 2020-10-16 ENCOUNTER — Encounter: Payer: Self-pay | Admitting: Family Medicine

## 2020-10-17 ENCOUNTER — Encounter: Payer: Self-pay | Admitting: Family Medicine

## 2020-10-17 MED ORDER — CELECOXIB 200 MG PO CAPS: 200.0000 mg | ORAL_CAPSULE | Freq: Every day | ORAL | 3 refills | Status: DC

## 2020-11-14 ENCOUNTER — Encounter: Payer: Self-pay | Admitting: Family Medicine

## 2020-11-14 MED ORDER — FLUTICASONE PROPIONATE 50 MCG/ACT NA SUSP: 1.0000 | Freq: Every day | NASAL | 11 refills | Status: DC

## 2020-11-27 ENCOUNTER — Other Ambulatory Visit: Payer: Self-pay | Admitting: Family Medicine

## 2020-11-27 DIAGNOSIS — I10 Essential (primary) hypertension: Secondary | ICD-10-CM

## 2021-01-10 ENCOUNTER — Encounter: Payer: Self-pay | Admitting: Family Medicine

## 2021-02-22 ENCOUNTER — Other Ambulatory Visit: Payer: Self-pay | Admitting: Family Medicine

## 2021-02-22 DIAGNOSIS — I10 Essential (primary) hypertension: Secondary | ICD-10-CM

## 2021-03-01 ENCOUNTER — Other Ambulatory Visit: Payer: Self-pay | Admitting: Family Medicine

## 2021-03-01 DIAGNOSIS — G2581 Restless legs syndrome: Secondary | ICD-10-CM

## 2021-04-03 ENCOUNTER — Encounter: Payer: Self-pay | Admitting: Family Medicine

## 2021-04-05 ENCOUNTER — Other Ambulatory Visit: Payer: Self-pay

## 2021-04-05 ENCOUNTER — Ambulatory Visit (INDEPENDENT_AMBULATORY_CARE_PROVIDER_SITE_OTHER): Payer: Self-pay | Admitting: Family

## 2021-04-05 VITALS — BP 100/60 | HR 60 | Temp 98.7°F | Resp 20 | Ht 65.0 in | Wt 269.0 lb

## 2021-04-05 DIAGNOSIS — J209 Acute bronchitis, unspecified: Secondary | ICD-10-CM

## 2021-04-05 DIAGNOSIS — E119 Type 2 diabetes mellitus without complications: Secondary | ICD-10-CM

## 2021-04-05 LAB — HEMOGLOBIN A1C: Hgb A1c MFr Bld: 6.1 % (ref 4.6–6.5)

## 2021-04-05 MED ORDER — PREDNISONE 20 MG PO TABS: ORAL_TABLET | ORAL | 0 refills | Status: DC

## 2021-04-05 MED ORDER — DOXYCYCLINE HYCLATE 100 MG PO CAPS
100.0000 mg | ORAL_CAPSULE | Freq: Two times a day (BID) | ORAL | 0 refills | Status: DC
Start: 2021-04-05 — End: 2022-02-04

## 2021-04-05 NOTE — Progress Notes (Signed)
Gabriela Torres is a 50 y.o. female with the following history as recorded in EpicCare:  Patient Active Problem List   Diagnosis Date Noted   Asthma 08/05/2018   Morbid obesity with BMI of 40.0-44.9, adult ([place]) 08/05/2018   Cough 08/05/2018   Diabetes mellitus type 2, controlled, without complications ([place]) 48/54/6270   Migraine 04/17/2013   HTN (hypertension) 05/18/2012   Hyperlipidemia 05/18/2012   Ventral hernia 09/03/2011    Current Outpatient Medications  Medication Sig Dispense Refill   albuterol (VENTOLIN HFA) 108 (90 Base) MCG/ACT inhaler Inhale 2 puffs into the lungs every 6 (six) hours as needed for wheezing or shortness of breath. 1 each 3   amphetamine-dextroamphetamine (ADDERALL) 20 MG tablet Take 20 mg by mouth daily.     carvedilol (COREG) 12.5 MG tablet TAKE 1 TABLET(12.5 MG) BY MOUTH TWICE DAILY 180 tablet 1   cloNIDine (CATAPRES) 0.1 MG tablet TAKE 1 TABLET(0.1 MG) BY MOUTH TWICE DAILY 180 tablet 1   doxycycline (VIBRAMYCIN) 100 MG capsule Take 1 capsule (100 mg total) by mouth 2 (two) times daily. 20 capsule 0   escitalopram (LEXAPRO) 20 MG tablet Take 20 mg by mouth daily.     fluticasone (FLONASE) 50 MCG/ACT nasal spray Place 1 spray into both nostrils daily. 16 g 11   furosemide (LASIX) 20 MG tablet Take 1 tablet (20 mg total) by mouth daily as needed. 90 tablet 1   montelukast (SINGULAIR) 10 MG tablet TAKE ONE TABLET BY MOUTH ONE TIME DAILY AT BEDTIME 90 tablet 3   predniSONE (DELTASONE) 20 MG tablet Take 40 mg daily for 3 days, then 20 mg daily for 3 days 9 tablet 0   rOPINIRole (REQUIP) 1 MG tablet TAKE ONE TABLET BY MOUTH ONE TIME DAILY AT BEDTIME 90 tablet 1   triamterene-hydrochlorothiazide (MAXZIDE-25) 37.5-25 MG tablet TAKE 1 TABLET BY MOUTH DAILY 90 tablet 3   No current facility-administered medications for this visit.    Allergies: Statins, Lisinopril, Norvasc [amlodipine], Food, and Guaifenesin & derivatives  Past Medical History:  Diagnosis Date    Abdominal pain    Abdominal wall hernia    Anxiety    Hearing loss    Hyperlipidemia    Hypertension    Migraines     Past Surgical History:  Procedure Laterality Date   ABDOMINAL HYSTERECTOMY  09/2003 or 09/2004    total    HERNIA REPAIR     NASAL TURBINATE REDUCTION  2000   TONSILLECTOMY  2008   VENTRAL HERNIA REPAIR  11/01/2011   Procedure: LAPAROSCOPIC VENTRAL HERNIA;  Surgeon: Merrie Roof, MD;  Location: [facility];  Service: General;  Laterality: N/A;    Family History  Problem Relation Age of Onset   Cancer Mother        abdominal   Cancer Father        colon   Heart disease Sister        chf   Breast cancer Maternal Aunt     Social History   Tobacco Use   Smoking status: Former    Packs/day: 0.25    Years: 1.00    Pack years: 0.25    Types: Cigarettes    Quit date: 10/24/1981    Years since quitting: 39.4   Smokeless tobacco: Never   Tobacco comments:    social drinker  Substance Use Topics   Alcohol use: Yes    Comment: 3 - 4 per week    Subjective:  Presents with concerns for  bronchitis; symptoms x 4 days; negative COVID test; brother has had similar symptoms- feels he "gave her the infection." + productive cough; has been using her albuterol inhaler;  Requesting Doxycycline and Prednisone which has worked well for her in the past; prone to recurrent bronchitis- underlying allergies/ asthma;   History of diet controlled diabetes- last Hgba1c done 08/2020; okay to get re-checked today;      Objective:  Vitals:   04/05/21 0941  BP: 100/60  Pulse: 60  Resp: 20  Temp: 98.7 F (37.1 C)  SpO2: 95%  Weight: 269 lb (122 kg)  Height: 5\' 5"  (1.651 m)    General: Well developed, well nourished, in no acute distress  Skin : Warm and dry.  Head: Normocephalic and atraumatic  Eyes: Sclera and conjunctiva clear; pupils round and reactive to light; extraocular movements intact  Ears: External normal; canals clear; tympanic membranes normal  Oropharynx:  Pink, supple. No suspicious lesions  Neck: Supple without thyromegaly, adenopathy  Lungs: Respirations unlabored; coarse breath sounds in upper lobes CVS exam: normal rate and regular rhythm.  Neurologic: Alert and oriented; speech intact; face symmetrical; moves all extremities well; CNII-XII intact without focal deficit   Assessment:  1. Diet-controlled diabetes mellitus ([place])   2. Acute bronchitis, unspecified organism     Plan:  Check Hgba1c today; Rx for Doxycycline and Prednisone- use as directed; increase fluids, rest and follow up worse, no better.   This visit occurred during the SARS-CoV-2 public health emergency.  Safety protocols were in place, including screening questions prior to the visit, additional usage of staff PPE, and extensive cleaning of exam room while observing appropriate contact time as indicated for disinfecting solutions.    No follow-ups on file.  Orders Placed This Encounter  Procedures   Hemoglobin A1c    Requested Prescriptions   Signed Prescriptions Disp Refills   doxycycline (VIBRAMYCIN) 100 MG capsule 20 capsule 0    Sig: Take 1 capsule (100 mg total) by mouth 2 (two) times daily.   predniSONE (DELTASONE) 20 MG tablet 9 tablet 0    Sig: Take 40 mg daily for 3 days, then 20 mg daily for 3 days

## 2021-04-23 ENCOUNTER — Encounter: Payer: Self-pay | Admitting: Family Medicine

## 2021-04-23 DIAGNOSIS — J209 Acute bronchitis, unspecified: Secondary | ICD-10-CM

## 2021-04-24 MED ORDER — PREDNISONE 20 MG PO TABS: ORAL_TABLET | ORAL | 0 refills | Status: DC

## 2021-06-28 ENCOUNTER — Other Ambulatory Visit: Payer: Self-pay | Admitting: Family Medicine

## 2021-06-28 DIAGNOSIS — I10 Essential (primary) hypertension: Secondary | ICD-10-CM

## 2021-07-13 ENCOUNTER — Other Ambulatory Visit: Payer: Self-pay | Admitting: Family Medicine

## 2021-07-13 DIAGNOSIS — G2581 Restless legs syndrome: Secondary | ICD-10-CM

## 2021-08-02 ENCOUNTER — Encounter: Payer: Self-pay | Admitting: Family Medicine

## 2021-08-02 DIAGNOSIS — J9801 Acute bronchospasm: Secondary | ICD-10-CM

## 2021-08-02 MED ORDER — ALBUTEROL SULFATE HFA 108 (90 BASE) MCG/ACT IN AERS: 2.0000 | INHALATION_SPRAY | Freq: Four times a day (QID) | RESPIRATORY_TRACT | 5 refills | Status: DC | PRN

## 2021-09-28 ENCOUNTER — Encounter: Payer: Self-pay | Admitting: Family Medicine

## 2021-09-28 DIAGNOSIS — G2581 Restless legs syndrome: Secondary | ICD-10-CM

## 2021-09-28 MED ORDER — ROPINIROLE HCL 1 MG PO TABS: 2.0000 mg | ORAL_TABLET | Freq: Every day | ORAL | 3 refills | Status: DC

## 2021-11-07 ENCOUNTER — Encounter: Payer: Self-pay | Admitting: Family Medicine

## 2021-11-07 DIAGNOSIS — I10 Essential (primary) hypertension: Secondary | ICD-10-CM

## 2021-11-07 MED ORDER — CARVEDILOL 12.5 MG PO TABS: ORAL_TABLET | ORAL | 1 refills | Status: DC

## 2021-11-08 ENCOUNTER — Other Ambulatory Visit: Payer: Self-pay | Admitting: Family Medicine

## 2021-12-25 ENCOUNTER — Other Ambulatory Visit: Payer: Self-pay | Admitting: Family Medicine

## 2021-12-25 DIAGNOSIS — I10 Essential (primary) hypertension: Secondary | ICD-10-CM

## 2022-01-06 ENCOUNTER — Other Ambulatory Visit: Payer: Self-pay | Admitting: Family Medicine

## 2022-01-24 NOTE — Progress Notes (Deleted)
[place] at [facility] [address], [place], [place] [address] [phone]  Date:  01/30/2022   Name:  Gabriela Torres   DOB:  Nov 13, 1970   MRN:  [id number]  PCP:  Darreld Mclean, MD    Chief Complaint: No chief complaint on file.   History of Present Illness:  Gabriela Torres is a 51 y.o. very pleasant female patient who presents with the following:  Patient seen today with concern of ear pain- History of well-controlled diabetes, hypertension, hyperlipidemia, asthma, obesity Most recent visit with myself was a video visit in March 2022, at that we have exchanged several messages since that time She is overdue for some services  Lab Results  Component Value Date   HGBA1C 6.1 04/05/2021   Colon cancer screening Eye exam Shingrix Foot exam Can offer HIV and hepatitis C screening, patient does work in [place] are overdue  Patient Active Problem List   Diagnosis Date Noted   Asthma 08/05/2018   Morbid obesity with BMI of 40.0-44.9, adult ([place]) 08/05/2018   Cough 08/05/2018   Diabetes mellitus type 2, controlled, without complications ([place]) 82/42/3536   Migraine 04/17/2013   HTN (hypertension) 05/18/2012   Hyperlipidemia 05/18/2012   Ventral hernia 09/03/2011    Past Medical History:  Diagnosis Date   Abdominal pain    Abdominal wall hernia    Anxiety    Hearing loss    Hyperlipidemia    Hypertension    Migraines     Past Surgical History:  Procedure Laterality Date   ABDOMINAL HYSTERECTOMY  09/2003 or 09/2004    total    HERNIA REPAIR     NASAL TURBINATE REDUCTION  2000   TONSILLECTOMY  2008   VENTRAL HERNIA REPAIR  11/01/2011   Procedure: LAPAROSCOPIC VENTRAL HERNIA;  Surgeon: Merrie Roof, MD;  Location: [facility];  Service: General;  Laterality: N/A;    Social History   Tobacco Use   Smoking status: Former    Packs/day: 0.25    Years: 1.00    Total pack years: 0.25    Types: Cigarettes     Quit date: 10/24/1981    Years since quitting: 40.2   Smokeless tobacco: Never   Tobacco comments:    social drinker  Vaping Use   Vaping Use: Never used  Substance Use Topics   Alcohol use: Yes    Comment: 3 - 4 per week   Drug use: No    Family History  Problem Relation Age of Onset   Cancer Mother        abdominal   Cancer Father        colon   Heart disease Sister        chf   Breast cancer Maternal Aunt     Allergies  Allergen Reactions   Statins Other (See Comments)    Left lower and rear flank pain.   Lisinopril     Angioedema    Norvasc [Amlodipine] Other (See Comments)    Gum hyperplasia   Food Nausea Only    Dill, rye   Guaifenesin & Derivatives Nausea And Vomiting    Medication list has been reviewed and updated.  Current Outpatient Medications on File Prior to Visit  Medication Sig Dispense Refill   albuterol (VENTOLIN HFA) 108 (90 Base) MCG/ACT inhaler Inhale 2 puffs into the lungs every 6 (six) hours as needed for wheezing or shortness of breath. 1  each 5   amphetamine-dextroamphetamine (ADDERALL) 20 MG tablet Take 20 mg by mouth daily.     carvedilol (COREG) 12.5 MG tablet TAKE 1 TABLET(12.5 MG) BY MOUTH TWICE DAILY 180 tablet 1   cloNIDine (CATAPRES) 0.1 MG tablet TAKE 1 TABLET(0.1 MG) BY MOUTH TWICE DAILY 180 tablet 1   doxycycline (VIBRAMYCIN) 100 MG capsule Take 1 capsule (100 mg total) by mouth 2 (two) times daily. 20 capsule 0   escitalopram (LEXAPRO) 20 MG tablet Take 20 mg by mouth daily.     fluticasone (FLONASE) 50 MCG/ACT nasal spray USE ONE SPRAY IN BOTH AFFECTED NOSTRILS TWICE A DAY 16 mL 11   furosemide (LASIX) 20 MG tablet Take 1 tablet (20 mg total) by mouth daily as needed. 90 tablet 1   montelukast (SINGULAIR) 10 MG tablet TAKE ONE TABLET BY MOUTH ONE TIME DAILY AT BEDTIME 90 tablet 3   predniSONE (DELTASONE) 20 MG tablet Take 40 mg daily for 3 days, then 20 mg daily for 3 days 9 tablet 0   rOPINIRole (REQUIP) 1 MG tablet Take 2  tablets (2 mg total) by mouth at bedtime. 180 tablet 3   triamterene-hydrochlorothiazide (MAXZIDE-25) 37.5-25 MG tablet TAKE 1 TABLET BY MOUTH DAILY 90 tablet 3   No current facility-administered medications on file prior to visit.    Review of Systems:  As per HPI- otherwise negative.   Physical Examination: There were no vitals filed for this visit. There were no vitals filed for this visit. There is no height or weight on file to calculate BMI. Ideal Body Weight:    GEN: no acute distress. HEENT: Atraumatic, Normocephalic.  Ears and Nose: No external deformity. CV: RRR, No M/G/R. No JVD. No thrill. No extra heart sounds. PULM: CTA B, no wheezes, crackles, rhonchi. No retractions. No resp. distress. No accessory muscle use. ABD: S, NT, ND, +BS. No rebound. No HSM. EXTR: No c/c/e PSYCH: Normally interactive. Conversant.    Assessment and Plan: ***  Signed Lamar Blinks, MD

## 2022-01-30 ENCOUNTER — Ambulatory Visit: Payer: Self-pay | Admitting: Family Medicine

## 2022-02-02 NOTE — Progress Notes (Addendum)
[facility] at [facility] [address], [address] [place], [place] [phone]  Date:  02/04/2022   Name:  Gabriela Torres   DOB:  01/07/1971   MRN:  [id number]  PCP:  Gabriela Cables, MD    Chief Complaint: Ear Pain (Left ear pain, about 2 months/Also having headaches)   History of Present Illness:  Gabriela Torres is a 51 y.o. very pleasant female patient who presents with the following:  Patient seen today for follow-up and also with concern of ear pain Most recent visit with myself virtually in March 2022 History of diabetes, hypertension, hyperlipidemia, asthma, obesity  Her brother suffered a stroke 11/06/20- he lives here in [place], they live together- the stroke caused right sided weakness and atrophy and some personality/ mentation changes  There is some tension and arguing in the household  [name] quit her job to take care of her brother and is now looking for a new job; she is currently without health insurance  She notes a bump in her left ear canal- tender- for about 6 weeks It is not as tender any longer but still hurts to press on it No change in her hearing- she did get a small amount of fluid from the bump at some point She has never had this before  4-5 months ago she was feeling dizzy - more vertigo than lightheadedness  She was taking clonidine and coreg; thought perhaps this combination was causing her dizziness She changed out the coreg for triamterence - she is taking them both twice a day.  She did start doubling up on her maxzide about a week ago She is not taking coreg right now  She stopped coreg perhaps a week ago The vertigo does seem related to movement of her head but not always The vertigo may last 2-3 minutes No buzzing or ringing in her ears, no specific hearing change She may get several episodes of vertigo in a cluster but then none for a few days   Colon cancer screening-mentioned this to her today.   We are eager to get this done but current lack of health insurance is a barrier Eye exam Shingles Foot exam Can offer HIV and hep C screening-we will do another time Due for labs and A1c Most recent labs February 2022 Patient Active Problem List   Diagnosis Date Noted   Asthma 08/05/2018   Morbid obesity with BMI of 40.0-44.9, adult ([facility]) 08/05/2018   Cough 08/05/2018   Diabetes mellitus type 2, controlled, without complications ([facility]) 12/19/2015   Migraine 04/17/2013   HTN (hypertension) 05/18/2012   Hyperlipidemia 05/18/2012   Ventral hernia 09/03/2011    Past Medical History:  Diagnosis Date   Abdominal pain    Abdominal wall hernia    Anxiety    Hearing loss    Hyperlipidemia    Hypertension    Migraines     Past Surgical History:  Procedure Laterality Date   ABDOMINAL HYSTERECTOMY  09/2003 or 09/2004    total    HERNIA REPAIR     NASAL TURBINATE REDUCTION  2000   TONSILLECTOMY  2008   VENTRAL HERNIA REPAIR  11/01/2011   Procedure: LAPAROSCOPIC VENTRAL HERNIA;  Surgeon: Robyne Askew, MD;  Location: [facility];  Service: General;  Laterality: N/A;    Social History   Tobacco Use   Smoking status: Former    Packs/day: 0.25    Years: 1.00  Total pack years: 0.25    Types: Cigarettes    Quit date: 10/24/1981    Years since quitting: 40.3   Smokeless tobacco: Never   Tobacco comments:    social drinker  Vaping Use   Vaping Use: Never used  Substance Use Topics   Alcohol use: Yes    Comment: 3 - 4 per week   Drug use: No    Family History  Problem Relation Age of Onset   Cancer Mother        abdominal   Cancer Father        colon   Heart disease Sister        chf   Breast cancer Maternal Aunt     Allergies  Allergen Reactions   Statins Other (See Comments)    Left lower and rear flank pain.   Lisinopril     Angioedema    Norvasc [Amlodipine] Other (See Comments)    Gum hyperplasia   Food Nausea Only    Dill, rye   Guaifenesin & Derivatives  Nausea And Vomiting    Medication list has been reviewed and updated.  Current Outpatient Medications on File Prior to Visit  Medication Sig Dispense Refill   albuterol (VENTOLIN HFA) 108 (90 Base) MCG/ACT inhaler Inhale 2 puffs into the lungs every 6 (six) hours as needed for wheezing or shortness of breath. 1 each 5   amphetamine-dextroamphetamine (ADDERALL) 20 MG tablet Take 20 mg by mouth daily.     cloNIDine (CATAPRES) 0.1 MG tablet TAKE 1 TABLET(0.1 MG) BY MOUTH TWICE DAILY 180 tablet 1   escitalopram (LEXAPRO) 20 MG tablet Take 20 mg by mouth daily.     fluticasone (FLONASE) 50 MCG/ACT nasal spray USE ONE SPRAY IN BOTH AFFECTED NOSTRILS TWICE A DAY 16 mL 11   furosemide (LASIX) 20 MG tablet Take 1 tablet (20 mg total) by mouth daily as needed. 90 tablet 1   montelukast (SINGULAIR) 10 MG tablet TAKE ONE TABLET BY MOUTH ONE TIME DAILY AT BEDTIME 90 tablet 3   rOPINIRole (REQUIP) 1 MG tablet Take 2 tablets (2 mg total) by mouth at bedtime. 180 tablet 3   triamterene-hydrochlorothiazide (MAXZIDE-25) 37.5-25 MG tablet TAKE 1 TABLET BY MOUTH DAILY 90 tablet 3   carvedilol (COREG) 12.5 MG tablet TAKE 1 TABLET(12.5 MG) BY MOUTH TWICE DAILY (Patient not taking: Reported on 02/04/2022) 180 tablet 1   No current facility-administered medications on file prior to visit.    Review of Systems:  As per HPI- otherwise negative.   Physical Examination: Vitals:   02/04/22 1338  BP: 114/80  Pulse: 76  Resp: 20  SpO2: 96%   Vitals:   02/04/22 1338  Weight: 208 lb (94.3 kg)  Height: 5\' 5"  (1.651 m)   Body mass index is 34.61 kg/m. Ideal Body Weight: Weight in (lb) to have BMI = 25: 149.9  GEN: no acute distress.  Obese, looks well HEENT: Atraumatic, Normocephalic. Bilateral TM wnl, oropharynx normal.  PEERL,EOMI.   Ears and Nose: No external deformity. CV: RRR, No M/G/R. No JVD. No thrill. No extra heart sounds. PULM: CTA B, no wheezes, crackles, rhonchi. No retractions. No resp.  distress. No accessory muscle use. ABD: S, NT, ND, +BS. No rebound. No HSM. EXTR: No c/c/e PSYCH: Normally interactive. Conversant.  Left ear: The internal aspect of the left tragus displays a possible cyst, potentially a sebaceous cyst.  It is not red or particularly tender at this time.  Given its location it is  difficult to see in detail Otherwise bilateral ears are normal Normal strength, sensation, deep tendon reflex of all limbs.  Normal facial movement and sensation.  Negative Dix-Hallpike bilaterally.  Normal Romberg Assessment and Plan: Essential hypertension - Plan: CBC, Comprehensive metabolic panel  Controlled type 2 diabetes mellitus without complication, without long-term current use of insulin ([facility]) - Plan: Comprehensive metabolic panel, Hemoglobin A1c, Microalbumin / creatinine urine ratio  Dyslipidemia - Plan: Lipid panel  Migraine without status migrainosus, not intractable, unspecified migraine type  Morbid obesity with BMI of 40.0-44.9, adult ([facility]) - Plan: TSH  Screening for thyroid disorder Patient seen today for follow-up She has recently altered her blood pressure medication regimen, she thought potentially Coreg was causing her vertigo symptoms. In any case, her blood pressure readings are okay today. We discussed her vertigo in detail.  It does not seem consistent with BPPV, Mnire's disease is a possibility.  Interestingly, since she increased her dose of diuretic she does notice some improvement We decided to start with lab work as above and stay in close contact.  She will try Epley maneuvers at home in case this may be helpful She has had the symptoms for several months so an acutely dangerous etiology is less likely Ordered blood work as above per patient request  Signed Abbe Amsterdam, MD  Received labs 8/1- message to pt  Results for orders placed or performed in visit on 02/04/22  CBC  Result Value Ref Range   WBC 7.2 4.0 - 10.5 K/uL   RBC 5.09  3.87 - 5.11 Mil/uL   Platelets 229.0 150.0 - 400.0 K/uL   Hemoglobin 12.9 12.0 - 15.0 g/dL   HCT 16.1 09.6 - 04.5 %   MCV 77.2 (L) 78.0 - 100.0 fl   MCHC 32.9 30.0 - 36.0 g/dL   RDW 40.9 81.1 - 91.4 %  Comprehensive metabolic panel  Result Value Ref Range   Sodium 137 135 - 145 mEq/L   Potassium 3.3 (L) 3.5 - 5.1 mEq/L   Chloride 99 96 - 112 mEq/L   CO2 30 19 - 32 mEq/L   Glucose, Bld 131 (H) 70 - 99 mg/dL   BUN 23 6 - 23 mg/dL   Creatinine, Ser 7.82 0.40 - 1.20 mg/dL   Total Bilirubin 0.5 0.2 - 1.2 mg/dL   Alkaline Phosphatase 75 39 - 117 U/L   AST 10 0 - 37 U/L   ALT 11 0 - 35 U/L   Total Protein 6.9 6.0 - 8.3 g/dL   Albumin 4.1 3.5 - 5.2 g/dL   GFR 95.62 >13.08 mL/min   Calcium 9.2 8.4 - 10.5 mg/dL  Hemoglobin M5H  Result Value Ref Range   Hgb A1c MFr Bld 6.7 (H) 4.6 - 6.5 %  Lipid panel  Result Value Ref Range   Cholesterol 236 (H) 0 - 200 mg/dL   Triglycerides 846.9 0.0 - 149.0 mg/dL   HDL 62.95 (L) >28.41 mg/dL   VLDL 32.4 0.0 - 40.1 mg/dL   LDL Cholesterol 027 (H) 0 - 99 mg/dL   Total CHOL/HDL Ratio 6    NonHDL 199.64   TSH  Result Value Ref Range   TSH 1.26 0.35 - 5.50 uIU/mL  Microalbumin / creatinine urine ratio  Result Value Ref Range   Microalb, Ur <0.7 0.0 - 1.9 mg/dL   Creatinine,U 25.3 mg/dL   Microalb Creat Ratio 1.0 0.0 - 30.0 mg/g

## 2022-02-02 NOTE — Patient Instructions (Incomplete)
It was good to see you again today, I will be in touch with your labs  Please set up a visit with your GI doc- Dr Smith - for your screening colonoscopy  [phone]  Please check with your insurance about coverage of GLP-1 agonist drugs for weight loss.  These drugs would include Saxenda, Wegovy, Zepbound  Please clarify with your insurance if they cover this for weight loss as opposed to diabetes.  If one of these medications is covered I am more than happy to prescribe you 

## 2022-02-04 ENCOUNTER — Encounter: Payer: Self-pay | Admitting: Family Medicine

## 2022-02-04 ENCOUNTER — Ambulatory Visit (INDEPENDENT_AMBULATORY_CARE_PROVIDER_SITE_OTHER): Payer: Self-pay | Admitting: Family Medicine

## 2022-02-04 VITALS — BP 114/80 | HR 76 | Resp 20 | Ht 65.0 in | Wt 208.0 lb

## 2022-02-04 DIAGNOSIS — G43909 Migraine, unspecified, not intractable, without status migrainosus: Secondary | ICD-10-CM

## 2022-02-04 DIAGNOSIS — I10 Essential (primary) hypertension: Secondary | ICD-10-CM

## 2022-02-04 DIAGNOSIS — Z1329 Encounter for screening for other suspected endocrine disorder: Secondary | ICD-10-CM

## 2022-02-04 DIAGNOSIS — E119 Type 2 diabetes mellitus without complications: Secondary | ICD-10-CM

## 2022-02-04 DIAGNOSIS — E785 Hyperlipidemia, unspecified: Secondary | ICD-10-CM

## 2022-02-04 DIAGNOSIS — Z6841 Body Mass Index (BMI) 40.0 and over, adult: Secondary | ICD-10-CM

## 2022-02-05 ENCOUNTER — Other Ambulatory Visit: Payer: Self-pay | Admitting: Family Medicine

## 2022-02-05 ENCOUNTER — Encounter: Payer: Self-pay | Admitting: Family Medicine

## 2022-02-05 DIAGNOSIS — I10 Essential (primary) hypertension: Secondary | ICD-10-CM

## 2022-02-05 LAB — COMPREHENSIVE METABOLIC PANEL
ALT: 11 U/L (ref 0–35)
AST: 10 U/L (ref 0–37)
Albumin: 4.1 g/dL (ref 3.5–5.2)
Alkaline Phosphatase: 75 U/L (ref 39–117)
BUN: 23 mg/dL (ref 6–23)
CO2: 30 mEq/L (ref 19–32)
Calcium: 9.2 mg/dL (ref 8.4–10.5)
Chloride: 99 mEq/L (ref 96–112)
Creatinine, Ser: 0.88 mg/dL (ref 0.40–1.20)
GFR: 75.57 mL/min (ref >60.00)
Glucose, Bld: 131 mg/dL — ABNORMAL HIGH (ref 70–99)
Potassium: 3.3 mEq/L — ABNORMAL LOW (ref 3.5–5.1)
Sodium: 137 mEq/L (ref 135–145)
Total Bilirubin: 0.5 mg/dL (ref 0.2–1.2)
Total Protein: 6.9 g/dL (ref 6.0–8.3)

## 2022-02-05 LAB — CBC
HCT: 39.3 % (ref 36.0–46.0)
Hemoglobin: 12.9 g/dL (ref 12.0–15.0)
MCHC: 32.9 g/dL (ref 30.0–36.0)
MCV: 77.2 fl — ABNORMAL LOW (ref 78.0–100.0)
Platelets: 229 10*3/uL (ref 150.0–400.0)
RBC: 5.09 Mil/uL (ref 3.87–5.11)
RDW: 15.5 % (ref 11.5–15.5)
WBC: 7.2 10*3/uL (ref 4.0–10.5)

## 2022-02-05 LAB — LIPID PANEL
Cholesterol: 236 mg/dL — ABNORMAL HIGH (ref 0–200)
HDL: 36.3 mg/dL — ABNORMAL LOW (ref >39.00)
LDL Cholesterol: 172 mg/dL — ABNORMAL HIGH (ref 0–99)
NonHDL: 199.64
Total CHOL/HDL Ratio: 6
Triglycerides: 138 mg/dL (ref 0.0–149.0)
VLDL: 27.6 mg/dL (ref 0.0–40.0)

## 2022-02-05 LAB — HEMOGLOBIN A1C: Hgb A1c MFr Bld: 6.7 % — ABNORMAL HIGH (ref 4.6–6.5)

## 2022-02-05 LAB — TSH: TSH: 1.26 u[IU]/mL (ref 0.35–5.50)

## 2022-02-05 LAB — MICROALBUMIN / CREATININE URINE RATIO
Creatinine,U: 72.6 mg/dL
Microalb Creat Ratio: 1 mg/g (ref 0.0–30.0)
Microalb, Ur: <0.7 mg/dL (ref 0.0–1.9)

## 2022-07-15 ENCOUNTER — Other Ambulatory Visit: Payer: Self-pay | Admitting: Family Medicine

## 2022-07-15 DIAGNOSIS — G2581 Restless legs syndrome: Secondary | ICD-10-CM

## 2022-08-03 IMAGING — MG MM DIGITAL SCREENING BILAT W/ TOMO AND CAD
8 series · 8 of 24 positions shown · non-contrast
Comparison: None.

CLINICAL DATA: Screening.

EXAM:
DIGITAL SCREENING BILATERAL MAMMOGRAM WITH TOMO AND CAD

[R MLO synth-2D]
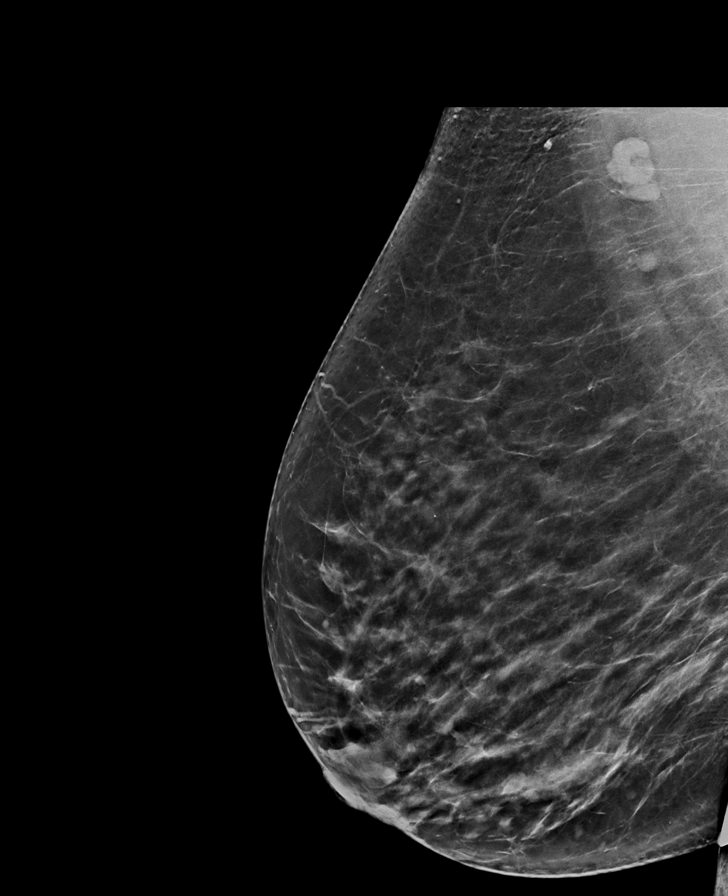

[L CC synth-2D]
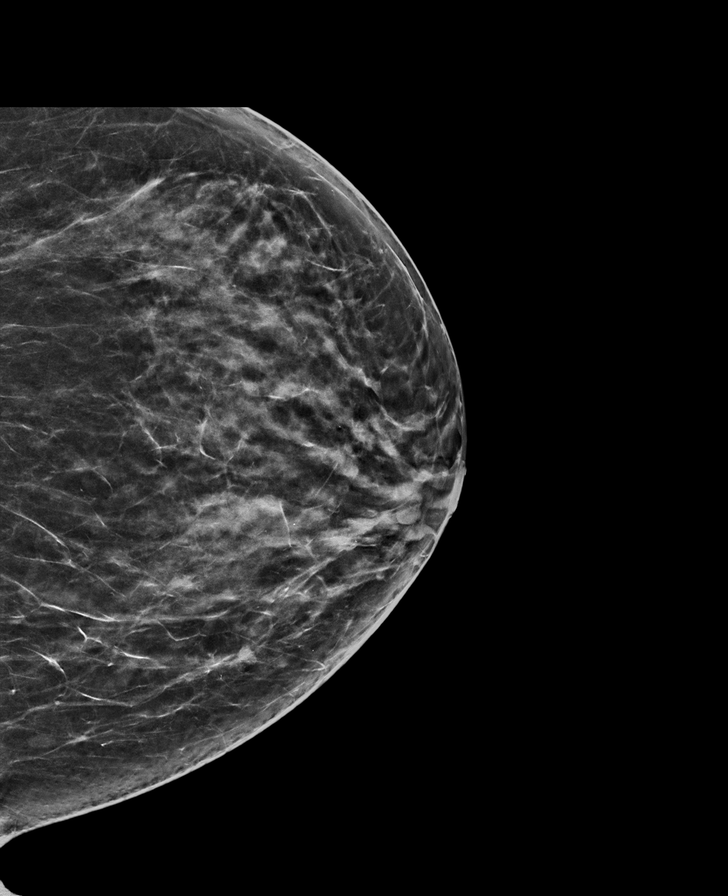

[L MLO synth-2D]
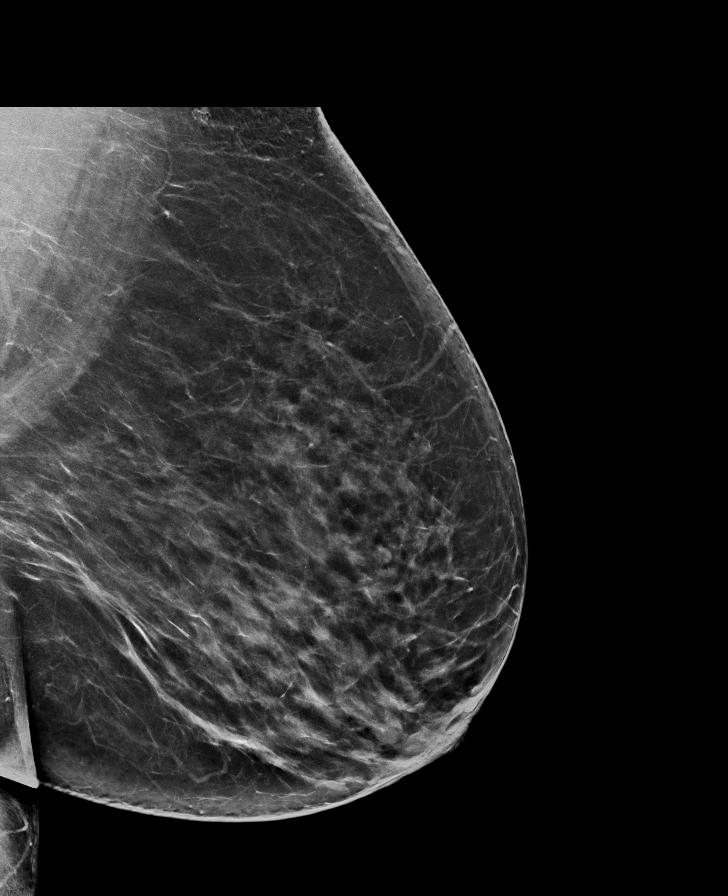

[R CC synth-2D]
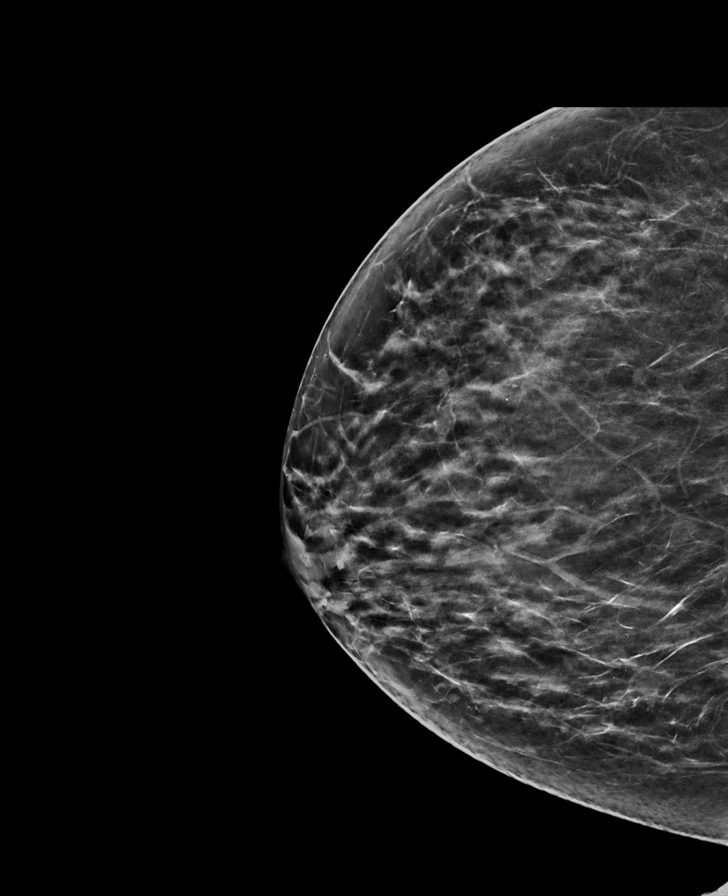

[R MLO tomo · tomo slice 39/78.0]
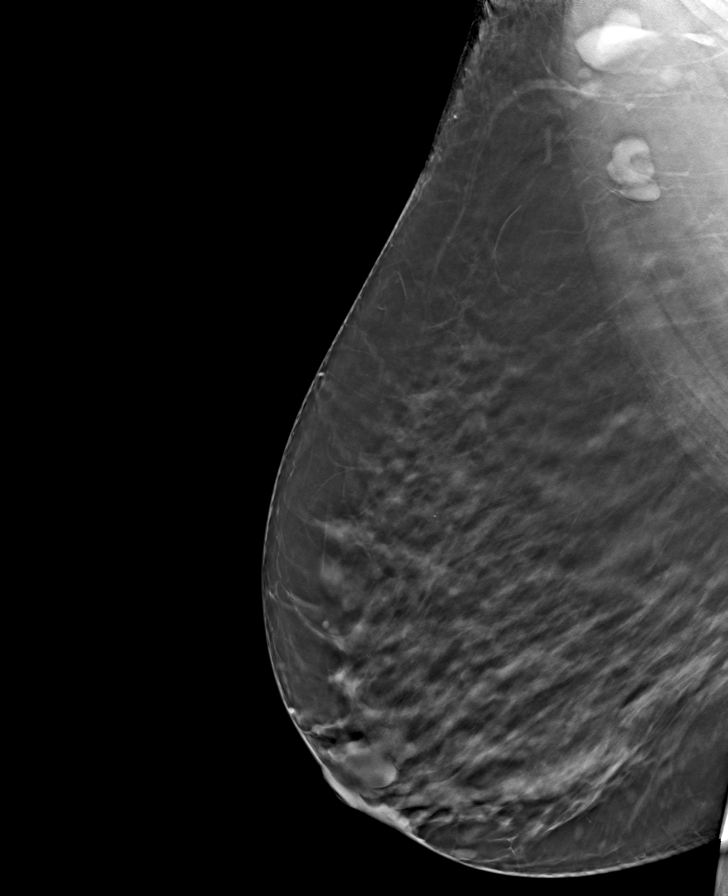

[R CC tomo · tomo slice 33/65.0]
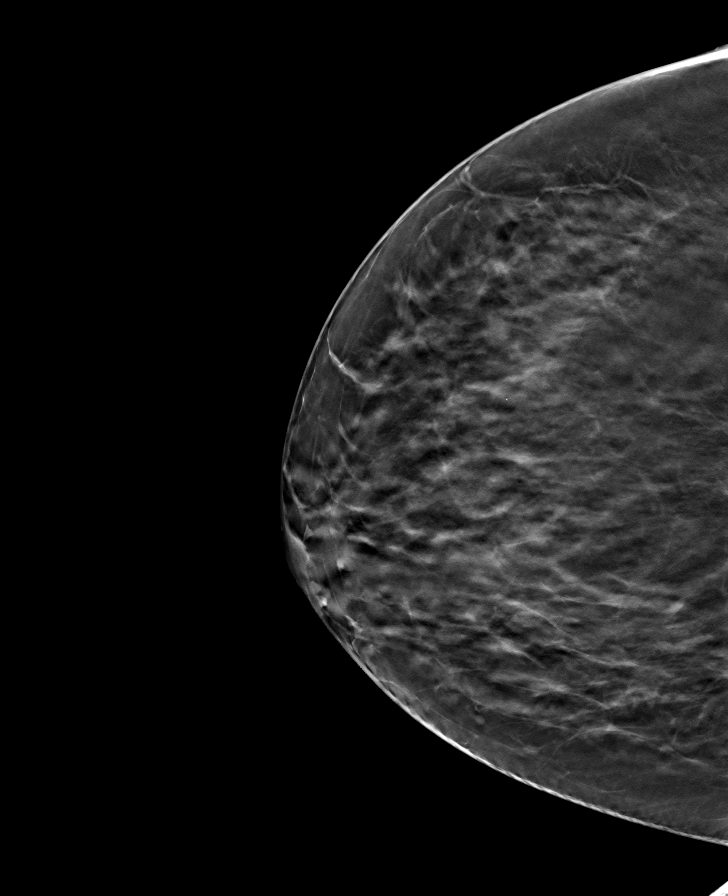

[L MLO tomo · tomo slice 40/79.0]
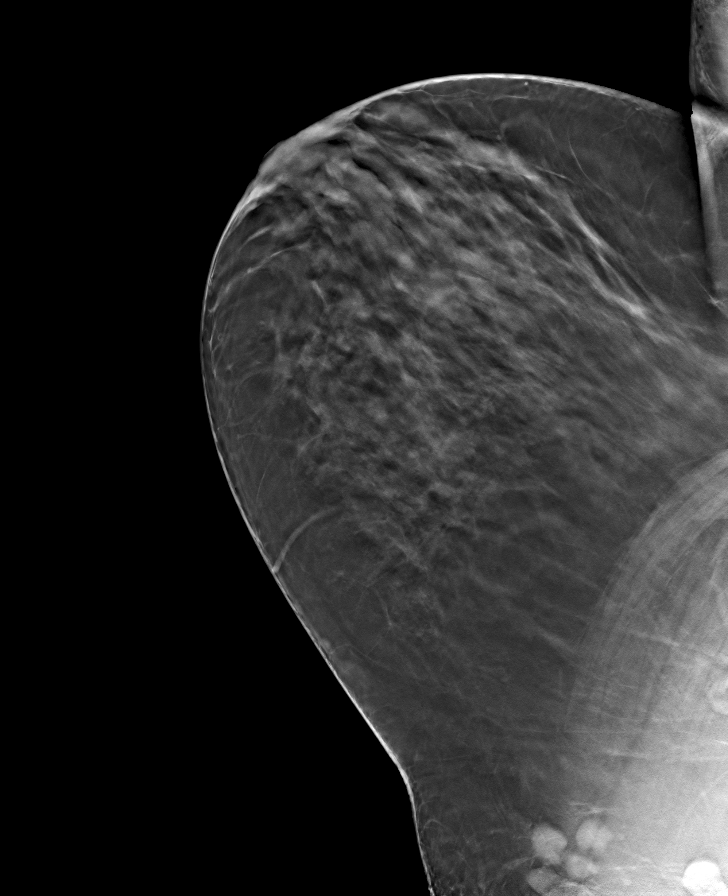

[L CC tomo · tomo slice 35/70.0]
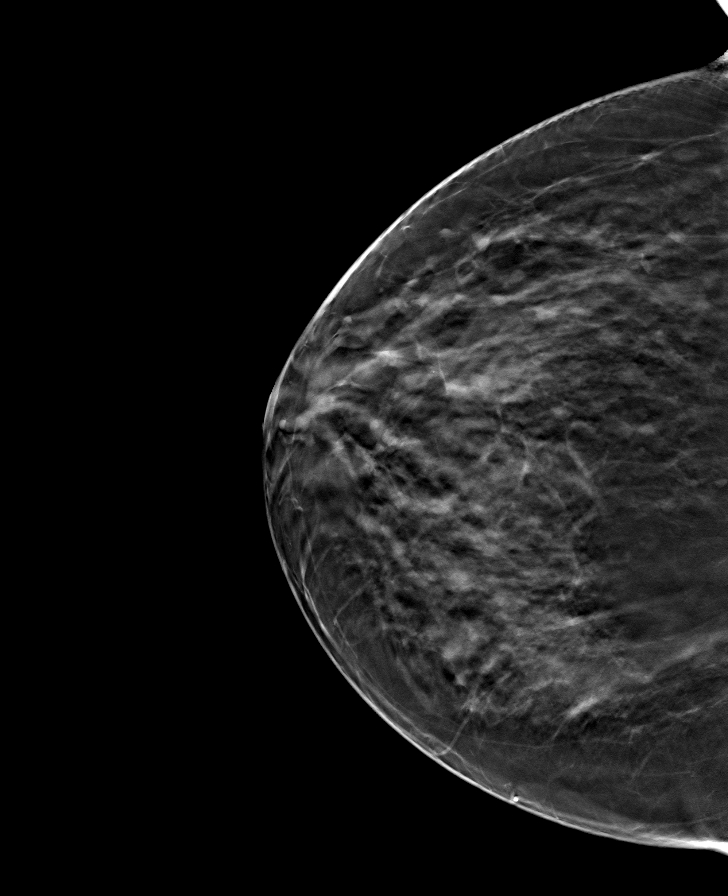

[8 of 24 positions shown; findings below may reference images not displayed]

ACR Breast Density Category c: The breast tissue is heterogeneously
dense, which may obscure small masses
FINDINGS: There are no findings suspicious for malignancy. The images were
evaluated with computer-aided detection.
IMPRESSION: No mammographic evidence of malignancy. A result letter of this
screening mammogram will be mailed directly to the patient.

RECOMMENDATION:
Screening mammogram in one year. (Code:FI-R-51M)

BI-RADS CATEGORY  1: Negative.

## 2022-09-10 NOTE — Progress Notes (Unsigned)
[place] at [facility] [place], [place], [place] [address] [phone]  Date:  09/11/2022   Name:  Gabriela Torres   DOB:  1971-06-12   MRN:  [id number]  PCP:  Darreld Mclean, MD    Chief Complaint: bronchitis flare up and right side muscle pain   History of Present Illness:  Gabriela Torres is a 52 y.o. very pleasant female patient who presents with the following:  Patient seen today with concern of bronchitis History of diabetes, obesity, hyperlipidemia, hypertension, asthma Most recent visit with myself was in July  At that time she was under some stress.  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 It got worse later that day  Cough started being productive on Monday She has noted body aches, stuffy nose No fever noted She did not test for covid as yet She does not think this could be the flu, she has not felt that bad She has noticed some wheezing No vomiting or diarrhea   Colon cancer screening- order GI referral  Shingrix Foot exam-update today Flu shot Most recent COVID booster Mammogram- will set up for her  Can update labs today  She also has noted some pain in her right hip mostly in the evenings after she works all day.  She does not notice when she is on her feet. This has been for perhaps 2 to 3 months Lab Results  Component Value Date   HGBA1C 6.7 (H) 02/04/2022     Patient Active Problem List   Diagnosis Date Noted   Asthma 08/05/2018   Morbid obesity with BMI of 40.0-44.9, adult ([place]) 08/05/2018   Cough 08/05/2018   Diabetes mellitus type 2,  controlled, without complications ([place]) [id number]   Migraine 04/17/2013   HTN (hypertension) 05/18/2012   Hyperlipidemia 05/18/2012   Ventral hernia 09/03/2011    Past Medical History:  Diagnosis Date   Abdominal pain    Abdominal wall hernia    Anxiety    Hearing loss    Hyperlipidemia    Hypertension    Migraines     Past Surgical History:  Procedure Laterality Date   ABDOMINAL HYSTERECTOMY  09/2003 or 09/2004    total    HERNIA REPAIR     NASAL TURBINATE REDUCTION  2000   TONSILLECTOMY  2008   VENTRAL HERNIA REPAIR  11/01/2011   Procedure: LAPAROSCOPIC VENTRAL HERNIA;  Surgeon: Merrie Roof, MD;  Location: [facility];  Service: General;  Laterality: N/A;    Social History   Tobacco Use   Smoking status: Former    Packs/day: 0.25    Years: 1.00    Total pack years: 0.25    Types: Cigarettes    Quit date: 10/24/1981    Years since quitting: 40.9   Smokeless tobacco: Never   Tobacco comments:    social drinker  Vaping Use   Vaping Use: Never used  Substance Use Topics   Alcohol use: Yes    Comment: 3 - 4 per week   Drug use: No    Family History  Problem Relation Age of Onset   Cancer Mother        abdominal   Cancer Father        colon   Heart disease Sister        chf   Breast cancer Maternal Aunt     Allergies  Allergen Reactions   Statins Other (See Comments)    Left lower and rear flank pain.   Lisinopril     Angioedema    Norvasc [Amlodipine] Other (See Comments)    Gum hyperplasia   Food Nausea Only    Dill, rye   Guaifenesin & Derivatives Nausea And Vomiting    Medication list has been reviewed and updated.  Current Outpatient Medications on File Prior to Visit  Medication Sig Dispense Refill   albuterol (VENTOLIN HFA) 108 (90 Base) MCG/ACT inhaler Inhale 2 puffs into the lungs every 6 (six) hours as needed for wheezing or shortness of breath. 1 each 5   amphetamine-dextroamphetamine (ADDERALL) 20 MG tablet Take 20 mg by mouth daily.      carvedilol (COREG) 12.5 MG tablet TAKE 1 TABLET(12.5 MG) BY MOUTH TWICE DAILY 180 tablet 1   escitalopram (LEXAPRO) 20 MG tablet Take 20 mg by mouth daily.     fluticasone (FLONASE) 50 MCG/ACT nasal spray USE ONE SPRAY IN BOTH AFFECTED NOSTRILS TWICE A DAY 16 mL 11   furosemide (LASIX) 20 MG tablet Take 1 tablet (20 mg total) by mouth daily as needed. 90 tablet 1   montelukast (SINGULAIR) 10 MG tablet TAKE ONE TABLET BY MOUTH ONE TIME DAILY AT BEDTIME 90 tablet 3   rOPINIRole (REQUIP) 1 MG tablet TAKE 2 TABLETS(2 MG) BY MOUTH AT BEDTIME 180 tablet 3   triamterene-hydrochlorothiazide (MAXZIDE-25) 37.5-25 MG tablet TAKE 1 TABLET BY MOUTH DAILY 90 tablet 3   No current facility-administered medications on file prior to visit.    Review of Systems:  As per HPI- otherwise negative.   Physical Examination: Vitals:   09/11/22 1438  BP: 130/86  Pulse: 72  Resp: 14  Temp: 97.7 F (36.5 C)  SpO2: 94%   Vitals:   09/11/22 1438  Weight: 276 lb (125.2 kg)  Height: '5\' 5"'$  (1.651 m)   Body mass index is 45.93 kg/m. Ideal Body Weight: Weight in (lb) to have BMI = 25: 149.9  GEN: no acute distress. Obese, looks well  HEENT: Atraumatic, Normocephalic.  Bilateral TM wnl, oropharynx normal.  PEERL,EOMI.   Ears and Nose: No external deformity. CV: RRR, No M/G/R. No JVD. No thrill. No extra heart sounds. PULM: CTA B, no wheezes, crackles, rhonchi. No retractions. No resp. distress. No accessory muscle use. ABD: S, NT, ND, +BS. No rebound. No HSM. EXTR: No c/c/e PSYCH: Normally interactive. Conversant.  Foot exam completed today, normal She has discomfort with resisted flexion of the right hip, tenderness over the right hip flexor/right hip joint Results for orders placed or performed in visit on 09/11/22  POC COVID-19 BinaxNow  Result Value Ref Range   SARS Coronavirus 2 Ag Negative Negative     Assessment and Plan: Essential hypertension - Plan: CBC, Comprehensive metabolic  panel  Controlled type 2 diabetes mellitus without complication, without long-term current use of insulin ([place]) - Plan: Hemoglobin A1c  Dyslipidemia - Plan: Lipid panel  Screening for thyroid disorder - Plan: TSH  Encounter for hepatitis C screening test for low risk patient - Plan: Hepatitis C antibody  Acute cough - Plan: POC COVID-19 BinaxNow  Right hip pain -  Plan: DG Hip Unilat W OR W/O Pelvis 2-3 Views Right  Encounter for screening mammogram for malignant neoplasm of breast - Plan: MM 3D DIAGNOSTIC MAMMOGRAM BILATERAL BREAST  Screening for malignant neoplasm of colon - Plan: Ambulatory referral to Gastroenterology  Bronchospasm - Plan: albuterol (VENTOLIN HFA) 108 (90 Base) MCG/ACT inhaler  Acute bronchitis with COPD ([place]) - Plan: predniSONE (DELTASONE) 20 MG tablet, doxycycline (VIBRAMYCIN) 100 MG capsule  Patient seen today for couple concerns.  We will work on patient for her health maintenance.  Lab work, mammogram, GI referral Will obtain right hip x-rays due to pain in the hip, this may be arthritis or due to hip flexor muscle strain Test negative for COVID today.  Will start prednisone taper, albuterol as needed.  Gave her prescription for doxycycline to use as needed-counseled this illness is likely viral however  Will plan further follow- up pending labs.   Signed Lamar Blinks, MD  Received labs as below, 3/7.  Message to pa Results for orders placed or performed in visit on 09/11/22  CBC  Result Value Ref Range   WBC 6.4 4.0 - 10.5 K/uL   RBC 4.97 3.87 - 5.11 Mil/uL   Platelets 205.0 150.0 - 400.0 K/uL   Hemoglobin 12.2 12.0 - 15.0 g/dL   HCT 38.0 36.0 - 46.0 %   MCV 76.3 (L) 78.0 - 100.0 fl   MCHC 32.2 30.0 - 36.0 g/dL   RDW 15.4 11.5 - 15.5 %  Comprehensive metabolic panel  Result Value Ref Range   Sodium 140 135 - 145 mEq/L   Potassium 3.7 3.5 - 5.1 mEq/L   Chloride 101 96 - 112 mEq/L   CO2 29 19 - 32 mEq/L   Glucose, Bld 171 (H) 70 - 99 mg/dL    BUN 18 6 - 23 mg/dL   Creatinine, Ser 0.69 0.40 - 1.20 mg/dL   Total Bilirubin 0.5 0.2 - 1.2 mg/dL   Alkaline Phosphatase 69 39 - 117 U/L   AST 12 0 - 37 U/L   ALT 13 0 - 35 U/L   Total Protein 6.6 6.0 - 8.3 g/dL   Albumin 3.7 3.5 - 5.2 g/dL   GFR 99.37 >60.00 mL/min   Calcium 9.4 8.4 - 10.5 mg/dL  Hemoglobin A1c  Result Value Ref Range   Hgb A1c MFr Bld 6.5 4.6 - 6.5 %  Lipid panel  Result Value Ref Range   Cholesterol 228 (H) 0 - 200 mg/dL   Triglycerides 105.0 0.0 - 149.0 mg/dL   HDL 38.00 (L) >39.00 mg/dL   VLDL 21.0 0.0 - 40.0 mg/dL   LDL Cholesterol 169 (H) 0 - 99 mg/dL   Total CHOL/HDL Ratio 6    NonHDL 189.58   TSH  Result Value Ref Range   TSH 1.96 0.35 - 5.50 uIU/mL  POC COVID-19 BinaxNow  Result Value Ref Range   SARS Coronavirus 2 Ag Negative Negative     .this

## 2022-09-11 ENCOUNTER — Ambulatory Visit (INDEPENDENT_AMBULATORY_CARE_PROVIDER_SITE_OTHER): Payer: 59 | Admitting: Family Medicine

## 2022-09-11 ENCOUNTER — Encounter: Payer: Self-pay | Admitting: Family Medicine

## 2022-09-11 VITALS — BP 130/86 | HR 72 | Temp 97.7°F | Resp 14 | Ht 65.0 in | Wt 276.0 lb

## 2022-09-11 DIAGNOSIS — J440 Chronic obstructive pulmonary disease with acute lower respiratory infection: Secondary | ICD-10-CM

## 2022-09-11 DIAGNOSIS — E119 Type 2 diabetes mellitus without complications: Secondary | ICD-10-CM | POA: Diagnosis not present

## 2022-09-11 DIAGNOSIS — E785 Hyperlipidemia, unspecified: Secondary | ICD-10-CM | POA: Diagnosis not present

## 2022-09-11 DIAGNOSIS — Z1231 Encounter for screening mammogram for malignant neoplasm of breast: Secondary | ICD-10-CM

## 2022-09-11 DIAGNOSIS — R051 Acute cough: Secondary | ICD-10-CM | POA: Diagnosis not present

## 2022-09-11 DIAGNOSIS — J209 Acute bronchitis, unspecified: Secondary | ICD-10-CM

## 2022-09-11 DIAGNOSIS — I10 Essential (primary) hypertension: Secondary | ICD-10-CM | POA: Diagnosis not present

## 2022-09-11 DIAGNOSIS — Z1211 Encounter for screening for malignant neoplasm of colon: Secondary | ICD-10-CM

## 2022-09-11 DIAGNOSIS — Z1159 Encounter for screening for other viral diseases: Secondary | ICD-10-CM | POA: Diagnosis not present

## 2022-09-11 DIAGNOSIS — M25551 Pain in right hip: Secondary | ICD-10-CM

## 2022-09-11 DIAGNOSIS — J9801 Acute bronchospasm: Secondary | ICD-10-CM

## 2022-09-11 DIAGNOSIS — Z1329 Encounter for screening for other suspected endocrine disorder: Secondary | ICD-10-CM

## 2022-09-11 LAB — POC COVID19 BINAXNOW: SARS Coronavirus 2 Ag: NEGATIVE

## 2022-09-11 MED ORDER — PREDNISONE 20 MG PO TABS: ORAL_TABLET | ORAL | 0 refills | Status: DC

## 2022-09-11 MED ORDER — DOXYCYCLINE HYCLATE 100 MG PO CAPS: 100.0000 mg | ORAL_CAPSULE | Freq: Two times a day (BID) | ORAL | 0 refills | Status: DC

## 2022-09-11 MED ORDER — ALBUTEROL SULFATE HFA 108 (90 BASE) MCG/ACT IN AERS: 2.0000 | INHALATION_SPRAY | Freq: Four times a day (QID) | RESPIRATORY_TRACT | 5 refills | Status: AC | PRN

## 2022-09-11 NOTE — Patient Instructions (Addendum)
It was good to see you again today, I ordered hip films for you to have done at the med center in [place] Please have these done at your convenience! [place]  [address] Main: [phone]  I will be in touch with your labs soon as possible.  I hope you are feeling much better soon Use albuterol as needed for wheezing, prednisone for 6 days, can use doxycycline antibiotic if needed/ desired

## 2022-09-12 ENCOUNTER — Encounter: Payer: Self-pay | Admitting: Family Medicine

## 2022-09-12 DIAGNOSIS — R718 Other abnormality of red blood cells: Secondary | ICD-10-CM

## 2022-09-12 DIAGNOSIS — E785 Hyperlipidemia, unspecified: Secondary | ICD-10-CM

## 2022-09-12 LAB — COMPREHENSIVE METABOLIC PANEL
ALT: 13 U/L (ref 0–35)
AST: 12 U/L (ref 0–37)
Albumin: 3.7 g/dL (ref 3.5–5.2)
Alkaline Phosphatase: 69 U/L (ref 39–117)
BUN: 18 mg/dL (ref 6–23)
CO2: 29 mEq/L (ref 19–32)
Calcium: 9.4 mg/dL (ref 8.4–10.5)
Chloride: 101 mEq/L (ref 96–112)
Creatinine, Ser: 0.69 mg/dL (ref 0.40–1.20)
GFR: 99.37 mL/min (ref >60.00)
Glucose, Bld: 171 mg/dL — ABNORMAL HIGH (ref 70–99)
Potassium: 3.7 mEq/L (ref 3.5–5.1)
Sodium: 140 mEq/L (ref 135–145)
Total Bilirubin: 0.5 mg/dL (ref 0.2–1.2)
Total Protein: 6.6 g/dL (ref 6.0–8.3)

## 2022-09-12 LAB — CBC
HCT: 38 % (ref 36.0–46.0)
Hemoglobin: 12.2 g/dL (ref 12.0–15.0)
MCHC: 32.2 g/dL (ref 30.0–36.0)
MCV: 76.3 fl — ABNORMAL LOW (ref 78.0–100.0)
Platelets: 205 10*3/uL (ref 150.0–400.0)
RBC: 4.97 Mil/uL (ref 3.87–5.11)
RDW: 15.4 % (ref 11.5–15.5)
WBC: 6.4 10*3/uL (ref 4.0–10.5)

## 2022-09-12 LAB — LIPID PANEL
Cholesterol: 228 mg/dL — ABNORMAL HIGH (ref 0–200)
HDL: 38 mg/dL — ABNORMAL LOW (ref >39.00)
LDL Cholesterol: 169 mg/dL — ABNORMAL HIGH (ref 0–99)
NonHDL: 189.58
Total CHOL/HDL Ratio: 6
Triglycerides: 105 mg/dL (ref 0.0–149.0)
VLDL: 21 mg/dL (ref 0.0–40.0)

## 2022-09-12 LAB — HEPATITIS C ANTIBODY: Hepatitis C Ab: NONREACTIVE

## 2022-09-12 LAB — TSH: TSH: 1.96 u[IU]/mL (ref 0.35–5.50)

## 2022-09-12 LAB — HEMOGLOBIN A1C: Hgb A1c MFr Bld: 6.5 % (ref 4.6–6.5)

## 2022-09-12 MED ORDER — ROSUVASTATIN CALCIUM 10 MG PO TABS: 10.0000 mg | ORAL_TABLET | Freq: Every day | ORAL | 3 refills | Status: DC

## 2022-09-29 ENCOUNTER — Encounter: Payer: Self-pay | Admitting: Family Medicine

## 2022-09-29 DIAGNOSIS — J209 Acute bronchitis, unspecified: Secondary | ICD-10-CM

## 2022-09-29 DIAGNOSIS — R052 Subacute cough: Secondary | ICD-10-CM

## 2022-09-29 DIAGNOSIS — I517 Cardiomegaly: Secondary | ICD-10-CM

## 2022-09-30 ENCOUNTER — Ambulatory Visit (HOSPITAL_BASED_OUTPATIENT_CLINIC_OR_DEPARTMENT_OTHER)
Admission: RE | Admit: 2022-09-30 | Discharge: 2022-09-30 | Disposition: A | Discharge status: Home / Self Care | Payer: 59 | Source: Ambulatory Visit | Attending: Family Medicine | Admitting: Family Medicine

## 2022-09-30 DIAGNOSIS — R052 Subacute cough: Secondary | ICD-10-CM | POA: Diagnosis not present

## 2022-09-30 DIAGNOSIS — R059 Cough, unspecified: Secondary | ICD-10-CM | POA: Diagnosis not present

## 2022-09-30 DIAGNOSIS — R079 Chest pain, unspecified: Secondary | ICD-10-CM | POA: Diagnosis not present

## 2022-09-30 MED ORDER — PREDNISONE 20 MG PO TABS: ORAL_TABLET | ORAL | 0 refills | Status: DC

## 2022-09-30 NOTE — Addendum Note (Signed)
Addended by: Lamar Blinks C on: 09/30/2022 01:34 PM   Modules accepted: Orders

## 2022-10-01 NOTE — Addendum Note (Signed)
Addended by: Darreld Mclean on: 10/01/2022 06:03 PM   Modules accepted: Orders

## 2022-10-03 ENCOUNTER — Encounter: Payer: Self-pay | Admitting: Family Medicine

## 2022-10-03 DIAGNOSIS — I10 Essential (primary) hypertension: Secondary | ICD-10-CM

## 2022-10-04 MED ORDER — CARVEDILOL 12.5 MG PO TABS: ORAL_TABLET | ORAL | 3 refills | Status: DC

## 2022-10-29 ENCOUNTER — Ambulatory Visit (HOSPITAL_COMMUNITY): Payer: 59

## 2022-12-09 ENCOUNTER — Encounter: Payer: Self-pay | Admitting: Family Medicine

## 2022-12-19 ENCOUNTER — Encounter: Payer: Self-pay | Admitting: Family Medicine

## 2023-07-15 ENCOUNTER — Encounter: Payer: Self-pay | Admitting: Family Medicine

## 2023-07-16 NOTE — Telephone Encounter (Signed)
 All that are in [facility] is Covid, and Pneumo23, Td from 1989

## 2023-07-16 NOTE — Telephone Encounter (Signed)
 Please check [facility] for this patient. Thank you!

## 2023-08-18 ENCOUNTER — Other Ambulatory Visit: Payer: Self-pay | Admitting: Family Medicine

## 2023-08-18 DIAGNOSIS — I10 Essential (primary) hypertension: Secondary | ICD-10-CM

## 2023-11-11 ENCOUNTER — Encounter: Payer: Self-pay | Admitting: Family Medicine

## 2023-11-11 DIAGNOSIS — I10 Essential (primary) hypertension: Secondary | ICD-10-CM

## 2023-11-11 MED ORDER — CARVEDILOL 12.5 MG PO TABS
ORAL_TABLET | ORAL | 0 refills | Status: DC
Start: 2023-11-11 — End: 2024-02-26

## 2024-02-21 NOTE — Progress Notes (Unsigned)
 [facility] [address], [address] [place], [place] [address] [phone]  Date:  02/25/2024   Name:  Gabriela Torres   DOB:  [birth year]   MRN:  [id number]  PCP:  Watt Harlene BROCKS, MD    Chief Complaint: No chief complaint on file.   History of Present Illness:  Gabriela Torres is a 53 y.o. very pleasant female patient who presents with the following:  Seen today for 6 month recheck Last visit with me 3/24- History of diabetes, obesity, hyperlipidemia, hypertension, asthma  Currently diet controlled DM  Lab Results  Component Value Date   HGBA1C 6.5 09/11/2022     Eye exam Mammo Time to update labs Foot exam  Pap  s/p hysterectomy Colon cancer screening   Patient Active Problem List   Diagnosis Date Noted   Asthma 08/05/2018   Morbid obesity with BMI of 40.0-44.9, adult (HCC) 08/05/2018   Cough 08/05/2018   Diabetes mellitus type 2, controlled, without complications (HCC) 12/19/2015   Migraine 04/17/2013   HTN (hypertension) 05/18/2012   Hyperlipidemia 05/18/2012   Ventral hernia 09/03/2011    Past Medical History:  Diagnosis Date   Abdominal pain    Abdominal wall hernia    Anxiety    Hearing loss    Hyperlipidemia    Hypertension    Migraines     Past Surgical History:  Procedure Laterality Date   ABDOMINAL HYSTERECTOMY  09/2003 or 09/2004    total    HERNIA REPAIR     NASAL TURBINATE REDUCTION  2000   TONSILLECTOMY  2008   VENTRAL HERNIA REPAIR  11/01/2011   Procedure: LAPAROSCOPIC VENTRAL HERNIA;  Surgeon: Deward GORMAN Curvin DOUGLAS, MD;  Location: [facility] OR;  Service: General;  Laterality: N/A;    Social History   Tobacco Use   Smoking status: Former    Current packs/day: 0.00    Average packs/day: 0.3 packs/day for 1 year (0.3 ttl pk-yrs)    Types: Cigarettes    Start date: 10/24/1980    Quit date: 10/24/1981    Years since quitting: 42.3   Smokeless tobacco: Never   Tobacco comments:    social  drinker  Vaping Use   Vaping status: Never Used  Substance Use Topics   Alcohol use: Yes    Comment: 3 - 4 per week   Drug use: No    Family History  Problem Relation Age of Onset   Cancer Mother        abdominal   Cancer Father        colon   Heart disease Sister        chf   Breast cancer Maternal Aunt     Allergies  Allergen Reactions   Statins Other (See Comments)    Left lower and rear flank pain.   Lisinopril      Angioedema    Norvasc  [Amlodipine ] Other (See Comments)    Gum hyperplasia   Food Nausea Only    Dill, rye   Guaifenesin & Derivatives Nausea And Vomiting    Medication list has been reviewed and updated.  Current Outpatient Medications on File Prior to Visit  Medication Sig Dispense Refill   albuterol  (VENTOLIN  HFA) 108 (90 Base) MCG/ACT inhaler Inhale 2 puffs into the lungs every 6 (six) hours as needed for wheezing or shortness of breath. 1 each 5   amphetamine-dextroamphetamine (ADDERALL) 20 MG tablet Take 20 mg by mouth daily.  carvedilol  (COREG ) 12.5 MG tablet TAKE 1 TABLET(12.5 MG) BY MOUTH TWICE DAILY 180 tablet 0   doxycycline  (VIBRAMYCIN ) 100 MG capsule Take 1 capsule (100 mg total) by mouth 2 (two) times daily. 20 capsule 0   escitalopram  (LEXAPRO ) 20 MG tablet Take 20 mg by mouth daily.     fluticasone  (FLONASE ) 50 MCG/ACT nasal spray USE ONE SPRAY IN BOTH AFFECTED NOSTRILS TWICE A DAY 16 mL 11   furosemide  (LASIX ) 20 MG tablet Take 1 tablet (20 mg total) by mouth daily as needed. 90 tablet 1   montelukast  (SINGULAIR ) 10 MG tablet TAKE ONE TABLET BY MOUTH ONE TIME DAILY AT BEDTIME 90 tablet 3   predniSONE  (DELTASONE ) 20 MG tablet Take 40 mg by mouth daily for 3 days, then 20 mg by mouth daily for 3 days 9 tablet 0   rOPINIRole  (REQUIP ) 1 MG tablet TAKE 2 TABLETS(2 MG) BY MOUTH AT BEDTIME 180 tablet 3   rosuvastatin  (CRESTOR ) 10 MG tablet Take 1 tablet (10 mg total) by mouth daily. 30 tablet 3   triamterene -hydrochlorothiazide  (MAXZIDE-25)  37.5-25 MG tablet TAKE 1 TABLET BY MOUTH DAILY 90 tablet 3   No current facility-administered medications on file prior to visit.    Review of Systems:  As per HPI- otherwise negative.   Physical Examination: There were no vitals filed for this visit. There were no vitals filed for this visit. There is no height or weight on file to calculate BMI. Ideal Body Weight:    GEN: no acute distress. HEENT: Atraumatic, Normocephalic.  Ears and Nose: No external deformity. CV: RRR, No M/G/R. No JVD. No thrill. No extra heart sounds. PULM: CTA B, no wheezes, crackles, rhonchi. No retractions. No resp. distress. No accessory muscle use. ABD: S, NT, ND, +BS. No rebound. No HSM. EXTR: No c/c/e PSYCH: Normally interactive. Conversant.    Assessment and Plan: ***  Signed Harlene Schroeder, MD

## 2024-02-21 NOTE — Patient Instructions (Incomplete)
 Good to see you again today

## 2024-02-25 ENCOUNTER — Encounter: Payer: Self-pay | Admitting: Family Medicine

## 2024-02-25 ENCOUNTER — Ambulatory Visit (INDEPENDENT_AMBULATORY_CARE_PROVIDER_SITE_OTHER): Payer: Self-pay | Admitting: Family Medicine

## 2024-02-25 VITALS — BP 140/82 | HR 74 | Temp 98.3°F | Ht 65.0 in | Wt 256.4 lb

## 2024-02-25 DIAGNOSIS — Z6841 Body Mass Index (BMI) 40.0 and over, adult: Secondary | ICD-10-CM

## 2024-02-25 DIAGNOSIS — E119 Type 2 diabetes mellitus without complications: Secondary | ICD-10-CM

## 2024-02-25 DIAGNOSIS — E782 Mixed hyperlipidemia: Secondary | ICD-10-CM

## 2024-02-25 DIAGNOSIS — Z1211 Encounter for screening for malignant neoplasm of colon: Secondary | ICD-10-CM

## 2024-02-25 DIAGNOSIS — G5603 Carpal tunnel syndrome, bilateral upper limbs: Secondary | ICD-10-CM

## 2024-02-25 DIAGNOSIS — Z1231 Encounter for screening mammogram for malignant neoplasm of breast: Secondary | ICD-10-CM

## 2024-02-25 DIAGNOSIS — I10 Essential (primary) hypertension: Secondary | ICD-10-CM

## 2024-02-25 MED ORDER — PREDNISONE 20 MG PO TABS: ORAL_TABLET | ORAL | 0 refills | Status: AC

## 2024-02-26 ENCOUNTER — Inpatient Hospital Stay (HOSPITAL_BASED_OUTPATIENT_CLINIC_OR_DEPARTMENT_OTHER): Admission: RE | Admit: 2024-02-26 | Payer: Self-pay | Source: Ambulatory Visit

## 2024-02-26 ENCOUNTER — Encounter: Payer: Self-pay | Admitting: Family Medicine

## 2024-02-26 ENCOUNTER — Telehealth: Payer: Self-pay | Admitting: Physician Assistant

## 2024-02-26 DIAGNOSIS — I10 Essential (primary) hypertension: Secondary | ICD-10-CM

## 2024-02-26 LAB — COMPREHENSIVE METABOLIC PANEL WITH GFR
ALT: 9 U/L (ref 0–35)
AST: 12 U/L (ref 0–37)
Albumin: 4.3 g/dL (ref 3.5–5.2)
Alkaline Phosphatase: 118 U/L — ABNORMAL HIGH (ref 39–117)
BUN: 18 mg/dL (ref 6–23)
CO2: 27 meq/L (ref 19–32)
Calcium: 9.4 mg/dL (ref 8.4–10.5)
Chloride: 102 meq/L (ref 96–112)
Creatinine, Ser: 0.67 mg/dL (ref 0.40–1.20)
GFR: 99.06 mL/min (ref >60.00)
Glucose, Bld: 82 mg/dL (ref 70–99)
Potassium: 3.5 meq/L (ref 3.5–5.1)
Sodium: 141 meq/L (ref 135–145)
Total Bilirubin: 0.7 mg/dL (ref 0.2–1.2)
Total Protein: 7.2 g/dL (ref 6.0–8.3)

## 2024-02-26 LAB — MICROALBUMIN / CREATININE URINE RATIO
Creatinine,U: 180.3 mg/dL
Microalb Creat Ratio: 17.9 mg/g (ref 0.0–30.0)
Microalb, Ur: 3.2 mg/dL — ABNORMAL HIGH (ref 0.0–1.9)

## 2024-02-26 LAB — LIPID PANEL
Cholesterol: 244 mg/dL — ABNORMAL HIGH (ref 0–200)
HDL: 49.3 mg/dL (ref >39.00)
LDL Cholesterol: 181 mg/dL — ABNORMAL HIGH (ref 0–99)
NonHDL: 195.02
Total CHOL/HDL Ratio: 5
Triglycerides: 70 mg/dL (ref 0.0–149.0)
VLDL: 14 mg/dL (ref 0.0–40.0)

## 2024-02-26 LAB — CBC
HCT: 41 % (ref 36.0–46.0)
Hemoglobin: 13.5 g/dL (ref 12.0–15.0)
MCHC: 32.9 g/dL (ref 30.0–36.0)
MCV: 75.6 fl — ABNORMAL LOW (ref 78.0–100.0)
Platelets: 249 K/uL (ref 150.0–400.0)
RBC: 5.42 Mil/uL — ABNORMAL HIGH (ref 3.87–5.11)
RDW: 15.1 % (ref 11.5–15.5)
WBC: 6.9 K/uL (ref 4.0–10.5)

## 2024-02-26 LAB — HEMOGLOBIN A1C: Hgb A1c MFr Bld: 6.5 % (ref 4.6–6.5)

## 2024-02-26 LAB — TSH: TSH: 1.78 u[IU]/mL (ref 0.35–5.50)

## 2024-02-26 MED ORDER — CARVEDILOL 12.5 MG PO TABS: ORAL_TABLET | ORAL | 0 refills | Status: DC

## 2024-02-26 NOTE — Telephone Encounter (Signed)
 Called pt left vm to make an appt for carpal tunnel with Morna please attach referral

## 2024-02-27 ENCOUNTER — Encounter: Payer: Self-pay | Admitting: Family Medicine

## 2024-02-27 DIAGNOSIS — E119 Type 2 diabetes mellitus without complications: Secondary | ICD-10-CM

## 2024-02-27 DIAGNOSIS — I10 Essential (primary) hypertension: Secondary | ICD-10-CM

## 2024-02-27 DIAGNOSIS — E782 Mixed hyperlipidemia: Secondary | ICD-10-CM

## 2024-02-27 MED ORDER — ROSUVASTATIN CALCIUM 5 MG PO TABS: 5.0000 mg | ORAL_TABLET | Freq: Every day | ORAL | 3 refills | Status: DC

## 2024-02-27 MED ORDER — CARVEDILOL 12.5 MG PO TABS: ORAL_TABLET | ORAL | 3 refills | Status: AC

## 2024-02-27 MED ORDER — ROSUVASTATIN CALCIUM 5 MG PO TABS: 5.0000 mg | ORAL_TABLET | Freq: Every day | ORAL | 3 refills | Status: AC

## 2024-02-27 NOTE — Addendum Note (Signed)
 Addended by: WATT HARLENE BROCKS on: 02/27/2024 06:18 AM   Modules accepted: Orders

## 2024-03-01 ENCOUNTER — Other Ambulatory Visit: Payer: Self-pay | Admitting: Family Medicine

## 2024-03-09 ENCOUNTER — Ambulatory Visit (INDEPENDENT_AMBULATORY_CARE_PROVIDER_SITE_OTHER): Payer: Self-pay | Admitting: Orthopaedic Surgery

## 2024-03-09 DIAGNOSIS — G5601 Carpal tunnel syndrome, right upper limb: Secondary | ICD-10-CM

## 2024-03-09 DIAGNOSIS — G5602 Carpal tunnel syndrome, left upper limb: Secondary | ICD-10-CM

## 2024-03-09 NOTE — Progress Notes (Signed)
 Office Visit Note   Patient: Gabriela Torres           Date of Birth: 12/23/1970           MRN: [id number] Visit Date: 03/09/2024              Requested by: Watt Harlene BROCKS, MD [address] [place],  [place] [address] PCP: Watt Harlene BROCKS, MD   Assessment & Plan: Visit Diagnoses:  1. Right carpal tunnel syndrome   2. Left carpal tunnel syndrome     Plan: History of Present Illness Gabriela Torres is a 53 year old female who presents with bilateral hand pain, numbness, and tingling.  She experiences pain, numbness, tingling, burning, and swelling in both hands, which have worsened over several months. Initially, braces provided relief, but they are no longer effective. Numbness now persists longer, with a recent episode lasting from Friday to late Sunday night.  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 She struggles with tasks requiring dexterity, such as using biopsy tools and handling snares, now needing both hands due to swelling and reduced dexterity.  She has not had nerve conduction studies or x-rays. She has been out of work since August 21st due to her symptoms, raising concerns about her ability to perform job duties safely and effectively.  Physical Exam MUSCULOSKELETAL: Positive carpal tunnel compression test bilaterally.  No muscle atrophy.  Good thumb opposition.  Assessment and Plan Bilateral carpal tunnel syndrome Chronic bilateral carpal tunnel syndrome with progressive symptoms confirmed by positive compression test. - Order EMG to confirm diagnosis and assess severity. - Consider surgery if EMG shows moderate or severe syndrome. - Consider cortisone injections and brace use if EMG shows mild syndrome. - Provide work modification note to restrict procedural work until follow-up.  Follow-Up Instructions: No follow-ups on file.   Orders:  No orders of the defined types were placed in this  encounter.  No orders of the defined types were placed in this encounter.     Procedures: No procedures performed   Clinical Data: No additional findings.   Subjective: Chief Complaint  Patient presents with   Left Hand - Pain   Right Hand - Pain    HPI  Review of Systems  Constitutional: Negative.   HENT: Negative.    Eyes: Negative.   Respiratory: Negative.    Cardiovascular: Negative.   Endocrine: Negative.   Musculoskeletal: Negative.   Neurological: Negative.   Hematological: Negative.   Psychiatric/Behavioral: Negative.    All other systems reviewed and are negative.    Objective: Vital Signs: There were no vitals taken for this visit.  Physical Exam Vitals and nursing note reviewed.  Constitutional:      Appearance: She is well-developed.  HENT:     Head: Atraumatic.     Nose: Nose normal.  Eyes:     Extraocular Movements: Extraocular movements intact.  Cardiovascular:     Pulses: Normal pulses.  Pulmonary:     Effort: Pulmonary effort is normal.  Abdominal:     Palpations: Abdomen is soft.  Musculoskeletal:     Cervical back: Neck supple.  Skin:    General: Skin is warm.     Capillary Refill: Capillary refill takes less than 2 seconds.  Neurological:     Mental Status: She is alert. Mental status is at baseline.  Psychiatric:        Behavior: Behavior normal.  Thought Content: Thought content normal.        Judgment: Judgment normal.     Ortho Exam  Specialty Comments:  No specialty comments available.  Imaging: No results found.   PMFS History: Patient Active Problem List   Diagnosis Date Noted   Asthma 08/05/2018   Morbid obesity with BMI of 40.0-44.9, adult (HCC) 08/05/2018   Cough 08/05/2018   Diabetes mellitus type 2, controlled, without complications (HCC) 12/19/2015   Migraine 04/17/2013   HTN (hypertension) 05/18/2012   Hyperlipidemia 05/18/2012   Ventral hernia 09/03/2011   Past Medical History:   Diagnosis Date   Abdominal pain    Abdominal wall hernia    Anxiety    Hearing loss    Hyperlipidemia    Hypertension    Migraines     Family History  Problem Relation Age of Onset   Cancer Mother        abdominal   Cancer Father        colon   Heart disease Sister        chf   Breast cancer Maternal Aunt     Past Surgical History:  Procedure Laterality Date   ABDOMINAL HYSTERECTOMY  09/2003 or 09/2004    total    HERNIA REPAIR     NASAL TURBINATE REDUCTION  2000   TONSILLECTOMY  2008   VENTRAL HERNIA REPAIR  11/01/2011   Procedure: LAPAROSCOPIC VENTRAL HERNIA;  Surgeon: Deward GORMAN Curvin DOUGLAS, MD;  Location: [facility] OR;  Service: General;  Laterality: N/A;   Social History   Occupational History   Not on file  Tobacco Use   Smoking status: Former    Current packs/day: 0.00    Average packs/day: 0.3 packs/day for 1 year (0.3 ttl pk-yrs)    Types: Cigarettes    Start date: 10/24/1980    Quit date: 10/24/1981    Years since quitting: 42.4   Smokeless tobacco: Never   Tobacco comments:    social drinker  Vaping Use   Vaping status: Never Used  Substance and Sexual Activity   Alcohol use: Yes    Comment: 3 - 4 per week   Drug use: No   Sexual activity: Not on file    Comment: hysterectomy

## 2024-03-10 ENCOUNTER — Encounter: Payer: Self-pay | Admitting: Orthopaedic Surgery

## 2024-03-11 ENCOUNTER — Encounter: Payer: Self-pay | Admitting: Family Medicine

## 2024-03-11 LAB — COLOGUARD: COLOGUARD: NEGATIVE

## 2024-03-22 ENCOUNTER — Telehealth: Payer: Self-pay | Admitting: Physical Medicine and Rehabilitation

## 2024-03-22 NOTE — Telephone Encounter (Signed)
 Pt called to reschedule with [facility]. Please call pt at [phone].

## 2024-03-26 ENCOUNTER — Encounter: Payer: Self-pay | Admitting: Physical Medicine and Rehabilitation

## 2024-03-31 ENCOUNTER — Ambulatory Visit: Payer: Self-pay | Admitting: Orthopaedic Surgery

## 2024-05-03 ENCOUNTER — Ambulatory Visit (HOSPITAL_BASED_OUTPATIENT_CLINIC_OR_DEPARTMENT_OTHER): Payer: Self-pay

## 2024-05-10 ENCOUNTER — Encounter: Payer: Self-pay | Admitting: Radiology

## 2024-06-11 ENCOUNTER — Encounter: Payer: Self-pay | Admitting: Physical Medicine and Rehabilitation

## 2024-06-17 ENCOUNTER — Ambulatory Visit: Payer: Self-pay | Admitting: Orthopaedic Surgery

## 2024-07-16 ENCOUNTER — Encounter: Payer: Self-pay | Admitting: Physical Medicine and Rehabilitation

## 2024-07-22 ENCOUNTER — Ambulatory Visit: Payer: Self-pay | Admitting: Orthopaedic Surgery

## 2024-07-23 ENCOUNTER — Ambulatory Visit: Payer: Self-pay | Admitting: Orthopaedic Surgery

## 2024-07-28 NOTE — Progress Notes (Unsigned)
 [facility], [employer] at [employer] [address], [address] [place], [place] [address] [phone]  Date:  07/29/2024   Name:  Gabriela Torres   DOB:  [birth year]   MRN:  [id number]  PCP:  Watt Harlene BROCKS, MD    Chief Complaint: Shortness of Breath (SOB at night its bad at night, it started this week. Ive had bronchitis )   History of Present Illness:  Gabriela Torres is a 54 y.o. very pleasant female patient who presents with the following:  Patient is in today with concern of shortness of breath and asthma exacerbation.  History of diabetes, hypertension, hyperlipidemia, obesity.  I saw her most recently in August Also history of asthma- not currently using inhalers  She does have a nebulizer machine  Discussed the use of AI scribe software for clinical note transcription with the patient, who gave verbal consent to proceed.  History of Present Illness Gabriela Torres is a 54 year old female with asthma who presents with shortness of breath and wheezing.  She has been experiencing significant shortness of breath, particularly severe at night, for the past four days, leading to difficulty sleeping. When she sits up, her breathing improves somewhat, as reported by the patient. She also experiences wheezing and has been using Advil with minimal relief. She has a nebulizer but is out of albuterol  for it.  Additional symptoms include a sore throat, headaches, chills alternating with feeling hot, and a single episode of diarrhea. No vomiting. She feels exhausted and has been out of work all week due to lack of energy from poor sleep.  She has a history of asthma and osteoarthritis in both knees. Her left leg has been bothering her for about two months, with discomfort when straightening it, but this has become a secondary concern due to her respiratory issues.  She has been using an albuterol  inhaler but is currently out of it. She also mentions  having a nebulizer but lacking the medication for it.  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 No history of glaucoma Given duoneb treatment   Patient Active Problem List   Diagnosis Date Noted   Asthma 08/05/2018   Morbid obesity with BMI of 40.0-44.9, adult (HCC) 08/05/2018   Cough 08/05/2018   Diabetes mellitus type 2, controlled, without complications (HCC) 12/19/2015   Migraine 04/17/2013   HTN (hypertension) 05/18/2012   Hyperlipidemia 05/18/2012   Ventral hernia 09/03/2011    Past Medical History:  Diagnosis Date   Abdominal pain    Abdominal wall hernia    Anxiety    Hearing loss    Hyperlipidemia    Hypertension    Migraines     Past Surgical History:  Procedure Laterality Date   ABDOMINAL HYSTERECTOMY  09/2003 or 09/2004    total    HERNIA REPAIR     NASAL TURBINATE REDUCTION  2000   TONSILLECTOMY  2008   VENTRAL HERNIA REPAIR  11/01/2011   Procedure: LAPAROSCOPIC VENTRAL HERNIA;  Surgeon: Deward GORMAN Curvin DOUGLAS, MD;  Location: [facility] OR;  Service: General;  Laterality: N/A;    Social History[1]  Family History  Problem Relation Age of Onset   Cancer Mother        abdominal   Cancer Father        colon   Heart disease Sister        chf   Breast cancer Maternal Aunt  Allergies[2]  Medication list has been reviewed and updated.  Medications Ordered Prior to Encounter[3]  Review of Systems:  As per HPI- otherwise negative.   Physical Examination: Vitals:   07/29/24 1354 07/29/24 1442  BP: (!) 140/92   Pulse: 67 67  SpO2: 91% 98%   Vitals:   07/29/24 1354  Weight: 265 lb 12.8 oz (120.6 kg)  Height: 5' 5 (1.651 m)   Body mass index is 44.23 kg/m. Ideal Body Weight: Weight in (lb) to have BMI = 25: 149.9  GEN: no acute distress.  Moderately obese, wheezing at beginning of visit HEENT: Atraumatic, Normocephalic.  Ears and Nose: No external deformity. CV: RRR, No M/G/R. No JVD. No thrill. No extra heart  sounds. PULM: Mild wheezing bilaterally no retractions. No resp. distress. No accessory muscle use. ABD: S, NT, ND, +BS. No rebound. No HSM. EXTR: No c/c/e PSYCH: Normally interactive. Conversant.   Treated with DuoNeb-she felt significantly better and oxygen saturations improved to 98%.  Wheezing resolved and her ability to speak in long phrases returned  Results for orders placed or performed in visit on 07/29/24  POC Covid19/Flu A&B Antigen   Collection Time: 07/29/24  2:47 PM  Result Value Ref Range   Influenza A Antigen, POC Negative Negative   Influenza B Antigen, POC Negative Negative   Covid Antigen, POC Negative Negative    Assessment and Plan: Acute cough - Plan: POC Covid19/Flu A&B Antigen, DG Chest 2 View  Mild intermittent asthma with exacerbation - Plan: ipratropium-albuterol  (DUONEB) 0.5-2.5 (3) MG/3ML nebulizer solution 3 mL, ipratropium-albuterol  (DUONEB) 0.5-2.5 (3) MG/3ML SOLN, predniSONE  (DELTASONE ) 20 MG tablet, albuterol  (VENTOLIN  HFA) 108 (90 Base) MCG/ACT inhaler, doxycycline  (VIBRAMYCIN ) 100 MG capsule, DG Chest 2 View  Assessment & Plan Mild intermittent asthma with acute exacerbation Acute exacerbation likely due to viral infection. Improvement after breathing treatment. No glaucoma history, allowing ipratropium use. - Prescribed prednisone : 2 pills daily for 3 days, then 1 pill daily for 3 days. - Prescribed albuterol  puffer as needed. - Prescribed nebulizer treatment with albuterol  and ipratropium. - Ordered chest x-ray if symptoms persist or worsen. - Advised to monitor symptoms and seek emergency care if breathing difficulties worsen.  Type 2 diabetes mellitus Recent A1c of 6.5. Prednisone  may increase blood sugar levels. No home glucometer available. - Ordered A1c test to get an idea of her average blood sugars recently - Advised to maintain hydration and monitor carbohydrate intake while on prednisone . - Instructed to monitor for significant changes  in blood sugar levels.  Signed Harlene Schroeder, MD     [1]  Social History Tobacco Use   Smoking status: Former    Current packs/day: 0.00    Average packs/day: 0.3 packs/day for 1 year (0.3 ttl pk-yrs)    Types: Cigarettes    Start date: 10/24/1980    Quit date: 10/24/1981    Years since quitting: 42.7   Smokeless tobacco: Never   Tobacco comments:    social drinker  Vaping Use   Vaping status: Never Used  Substance Use Topics   Alcohol use: Yes    Comment: 3 - 4 per week   Drug use: No  [2]  Allergies Allergen Reactions   Statins Other (See Comments)    Left lower and rear flank pain.   Lisinopril      Angioedema    Norvasc  [Amlodipine ] Other (See Comments)    Gum hyperplasia   Food Nausea Only    Dill, rye   Guaifenesin & Derivatives Nausea And  Vomiting  [3]  Current Outpatient Medications on File Prior to Visit  Medication Sig Dispense Refill   albuterol  (VENTOLIN  HFA) 108 (90 Base) MCG/ACT inhaler Inhale 2 puffs into the lungs every 6 (six) hours as needed for wheezing or shortness of breath. 1 each 5   amphetamine-dextroamphetamine (ADDERALL) 20 MG tablet Take 20 mg by mouth daily.     carvedilol  (COREG ) 12.5 MG tablet TAKE 1 TABLET(12.5 MG) BY MOUTH TWICE DAILY 180 tablet 3   escitalopram  (LEXAPRO ) 20 MG tablet Take 20 mg by mouth daily.     fluticasone  (FLONASE ) 50 MCG/ACT nasal spray USE ONE SPRAY IN BOTH AFFECTED NOSTRILS TWICE A DAY 16 mL 11   furosemide  (LASIX ) 20 MG tablet Take 1 tablet (20 mg total) by mouth daily as needed. 90 tablet 1   montelukast  (SINGULAIR ) 10 MG tablet TAKE ONE TABLET BY MOUTH ONE TIME DAILY AT BEDTIME 90 tablet 3   predniSONE  (DELTASONE ) 20 MG tablet Take 40 mg by mouth daily for 3 days, then 20 mg by mouth daily for 3 days 9 tablet 0   rOPINIRole  (REQUIP ) 1 MG tablet TAKE 2 TABLETS(2 MG) BY MOUTH AT BEDTIME 180 tablet 3   rosuvastatin  (CRESTOR ) 5 MG tablet Take 1 tablet (5 mg total) by mouth daily. 90 tablet 3   No current  facility-administered medications on file prior to visit.   "

## 2024-07-29 ENCOUNTER — Encounter: Payer: Self-pay | Admitting: Family Medicine

## 2024-07-29 ENCOUNTER — Ambulatory Visit (INDEPENDENT_AMBULATORY_CARE_PROVIDER_SITE_OTHER): Payer: Self-pay | Admitting: Family Medicine

## 2024-07-29 VITALS — BP 140/92 | HR 67 | Ht 65.0 in | Wt 265.8 lb

## 2024-07-29 DIAGNOSIS — J4521 Mild intermittent asthma with (acute) exacerbation: Secondary | ICD-10-CM

## 2024-07-29 DIAGNOSIS — E119 Type 2 diabetes mellitus without complications: Secondary | ICD-10-CM

## 2024-07-29 DIAGNOSIS — R051 Acute cough: Secondary | ICD-10-CM

## 2024-07-29 DIAGNOSIS — Z5181 Encounter for therapeutic drug level monitoring: Secondary | ICD-10-CM

## 2024-07-29 LAB — POC COVID19/FLU A&B COMBO
Covid Antigen, POC: NEGATIVE
Influenza A Antigen, POC: NEGATIVE
Influenza B Antigen, POC: NEGATIVE

## 2024-07-29 MED ORDER — DOXYCYCLINE HYCLATE 100 MG PO CAPS: 100.0000 mg | ORAL_CAPSULE | Freq: Two times a day (BID) | ORAL | 0 refills | Status: AC

## 2024-07-29 MED ORDER — IPRATROPIUM-ALBUTEROL 0.5-2.5 (3) MG/3ML IN SOLN: 3.0000 mL | Freq: Once | RESPIRATORY_TRACT | Status: AC

## 2024-07-29 MED ORDER — PREDNISONE 20 MG PO TABS: ORAL_TABLET | ORAL | 0 refills | Status: AC

## 2024-07-29 MED ORDER — IPRATROPIUM-ALBUTEROL 0.5-2.5 (3) MG/3ML IN SOLN: 3.0000 mL | Freq: Four times a day (QID) | RESPIRATORY_TRACT | 1 refills | Status: AC | PRN

## 2024-07-29 MED ORDER — ALBUTEROL SULFATE HFA 108 (90 BASE) MCG/ACT IN AERS: 2.0000 | INHALATION_SPRAY | Freq: Four times a day (QID) | RESPIRATORY_TRACT | 0 refills | Status: AC | PRN

## 2024-07-30 ENCOUNTER — Encounter: Payer: Self-pay | Admitting: Family Medicine

## 2024-07-30 LAB — HEPATIC FUNCTION PANEL
ALT: 10 U/L (ref 3–35)
AST: 11 U/L (ref 5–37)
Albumin: 4.2 g/dL (ref 3.5–5.2)
Alkaline Phosphatase: 91 U/L (ref 39–117)
Bilirubin, Direct: 0.1 mg/dL (ref 0.1–0.3)
Total Bilirubin: 0.6 mg/dL (ref 0.2–1.2)
Total Protein: 6.9 g/dL (ref 6.0–8.3)

## 2024-07-30 LAB — HEMOGLOBIN A1C: Hgb A1c MFr Bld: 6.1 % (ref 4.6–6.5)

## 2024-08-09 ENCOUNTER — Ambulatory Visit (HOSPITAL_BASED_OUTPATIENT_CLINIC_OR_DEPARTMENT_OTHER): Payer: Self-pay

## 2024-08-27 ENCOUNTER — Encounter: Payer: Self-pay | Admitting: Physical Medicine and Rehabilitation

## 2024-09-27 ENCOUNTER — Ambulatory Visit (HOSPITAL_BASED_OUTPATIENT_CLINIC_OR_DEPARTMENT_OTHER): Payer: Self-pay
